# Patient Record
Sex: Male | Born: 1985 | Race: White | Hispanic: No | Marital: Single | Smoking: Former smoker
Health system: Southern US, Community
[De-identification: ages and names within clinical notes are randomized; demographics above are authoritative.]

## PROBLEM LIST (undated history)

## (undated) DIAGNOSIS — K219 Gastro-esophageal reflux disease without esophagitis: Secondary | ICD-10-CM

## (undated) DIAGNOSIS — J45909 Unspecified asthma, uncomplicated: Secondary | ICD-10-CM

## (undated) HISTORY — DX: Gastro-esophageal reflux disease without esophagitis: K21.9

## (undated) HISTORY — DX: Unspecified asthma, uncomplicated: J45.909

---

## 2015-06-21 DIAGNOSIS — J029 Acute pharyngitis, unspecified: Secondary | ICD-10-CM | POA: Diagnosis not present

## 2015-06-21 DIAGNOSIS — H5710 Ocular pain, unspecified eye: Secondary | ICD-10-CM | POA: Diagnosis not present

## 2015-10-17 DIAGNOSIS — F17200 Nicotine dependence, unspecified, uncomplicated: Secondary | ICD-10-CM | POA: Diagnosis not present

## 2015-10-17 DIAGNOSIS — R002 Palpitations: Secondary | ICD-10-CM | POA: Diagnosis not present

## 2015-10-17 DIAGNOSIS — R079 Chest pain, unspecified: Secondary | ICD-10-CM | POA: Diagnosis not present

## 2015-10-17 DIAGNOSIS — R0789 Other chest pain: Secondary | ICD-10-CM | POA: Diagnosis not present

## 2015-10-17 DIAGNOSIS — F1410 Cocaine abuse, uncomplicated: Secondary | ICD-10-CM | POA: Diagnosis not present

## 2015-10-18 DIAGNOSIS — F17200 Nicotine dependence, unspecified, uncomplicated: Secondary | ICD-10-CM | POA: Diagnosis not present

## 2015-10-18 DIAGNOSIS — R0789 Other chest pain: Secondary | ICD-10-CM | POA: Diagnosis not present

## 2015-10-18 DIAGNOSIS — F1410 Cocaine abuse, uncomplicated: Secondary | ICD-10-CM | POA: Diagnosis not present

## 2015-10-18 DIAGNOSIS — R079 Chest pain, unspecified: Secondary | ICD-10-CM | POA: Diagnosis not present

## 2015-10-18 DIAGNOSIS — R002 Palpitations: Secondary | ICD-10-CM | POA: Diagnosis not present

## 2015-12-09 DIAGNOSIS — F17200 Nicotine dependence, unspecified, uncomplicated: Secondary | ICD-10-CM | POA: Diagnosis not present

## 2015-12-09 DIAGNOSIS — R0602 Shortness of breath: Secondary | ICD-10-CM | POA: Diagnosis not present

## 2015-12-09 DIAGNOSIS — Z720 Tobacco use: Secondary | ICD-10-CM | POA: Diagnosis not present

## 2015-12-09 DIAGNOSIS — J40 Bronchitis, not specified as acute or chronic: Secondary | ICD-10-CM | POA: Diagnosis not present

## 2015-12-09 DIAGNOSIS — R0789 Other chest pain: Secondary | ICD-10-CM | POA: Diagnosis not present

## 2015-12-30 DIAGNOSIS — N342 Other urethritis: Secondary | ICD-10-CM | POA: Diagnosis not present

## 2015-12-30 DIAGNOSIS — R300 Dysuria: Secondary | ICD-10-CM | POA: Diagnosis not present

## 2016-01-14 DIAGNOSIS — K219 Gastro-esophageal reflux disease without esophagitis: Secondary | ICD-10-CM | POA: Diagnosis not present

## 2016-01-14 DIAGNOSIS — Z6827 Body mass index (BMI) 27.0-27.9, adult: Secondary | ICD-10-CM | POA: Diagnosis not present

## 2016-01-14 DIAGNOSIS — R0602 Shortness of breath: Secondary | ICD-10-CM | POA: Diagnosis not present

## 2016-01-14 DIAGNOSIS — R079 Chest pain, unspecified: Secondary | ICD-10-CM | POA: Diagnosis not present

## 2016-01-14 DIAGNOSIS — Z1389 Encounter for screening for other disorder: Secondary | ICD-10-CM | POA: Diagnosis not present

## 2016-01-15 DIAGNOSIS — Z1389 Encounter for screening for other disorder: Secondary | ICD-10-CM | POA: Diagnosis not present

## 2016-01-15 DIAGNOSIS — Z6827 Body mass index (BMI) 27.0-27.9, adult: Secondary | ICD-10-CM | POA: Diagnosis not present

## 2016-01-15 DIAGNOSIS — R0602 Shortness of breath: Secondary | ICD-10-CM | POA: Diagnosis not present

## 2016-01-21 ENCOUNTER — Encounter (INDEPENDENT_AMBULATORY_CARE_PROVIDER_SITE_OTHER): Payer: Self-pay | Admitting: Internal Medicine

## 2016-01-30 ENCOUNTER — Ambulatory Visit (INDEPENDENT_AMBULATORY_CARE_PROVIDER_SITE_OTHER): Payer: 59 | Admitting: Internal Medicine

## 2016-01-30 ENCOUNTER — Encounter (INDEPENDENT_AMBULATORY_CARE_PROVIDER_SITE_OTHER): Payer: Self-pay | Admitting: Internal Medicine

## 2016-01-30 DIAGNOSIS — K219 Gastro-esophageal reflux disease without esophagitis: Secondary | ICD-10-CM

## 2016-01-30 NOTE — Progress Notes (Signed)
   Subjective:    Patient ID: Jacob Barber, male    DOB: Jul 11, 1985, 30 y.o.   MRN: [id number]  HPI Referred by [facility] for epigastric pain/reflux. He tells me he has been having a burning sensation in his chest. It makes him anxious. Symptoms last for about an hour. Symptoms for 2-3 months. The pain is sometimes random and sometimes after he eats. No lightheadedness. Does not cough. He does have some SOB and the discomfort in the chest. He says the Nexium helps. He says he ran out of the Nexium last week and he could tell a difference. He has a cardiology tomorrow. He says his EKG have been normal.   01/15/2016 H and H 15.6 and 45.8, total bili 0.7, AlP 54, AS 34, ALT 70 Review of Systems No past medical history on file.  No past surgical history on file.  Allergies  Allergen Reactions  . Erythromycin     hives  . Sulfa Antibiotics     hives    No current outpatient prescriptions on file prior to visit.   No current facility-administered medications on file prior to visit.        Objective:   Physical Exam Blood pressure 130/80, pulse 60, temperature 98.4 F (36.9 C), height 6\' 1"  (1.854 m), weight 205 lb 9.6 oz (93.3 kg). Alert and oriented. Skin warm and dry. Oral mucosa is moist.   . Sclera anicteric, conjunctivae is pink. Thyroid not enlarged. No cervical lymphadenopathy. Lungs clear. Heart regular rate and rhythm.  Abdomen is soft. Bowel sounds are positive. No hepatomegaly. No abdominal masses felt. No tenderness.  No edema to lower extremities.          Assessment & Plan:  Chest pain. ? Etiology. Has cardiology appt tomorrow. Will til Cardiology clears before we proceed with a EGD.

## 2016-01-30 NOTE — Patient Instructions (Addendum)
Continue Nexium BID. Call and let me know what Cardiology says.

## 2016-01-31 ENCOUNTER — Encounter (INDEPENDENT_AMBULATORY_CARE_PROVIDER_SITE_OTHER): Payer: Self-pay

## 2016-01-31 ENCOUNTER — Encounter: Payer: Self-pay | Admitting: Cardiology

## 2016-01-31 ENCOUNTER — Ambulatory Visit (INDEPENDENT_AMBULATORY_CARE_PROVIDER_SITE_OTHER): Payer: 59 | Admitting: Cardiology

## 2016-01-31 VITALS — BP 126/80 | HR 75 | Ht 73.0 in | Wt 207.0 lb

## 2016-01-31 DIAGNOSIS — Z720 Tobacco use: Secondary | ICD-10-CM

## 2016-01-31 DIAGNOSIS — R0602 Shortness of breath: Secondary | ICD-10-CM

## 2016-01-31 DIAGNOSIS — J45909 Unspecified asthma, uncomplicated: Secondary | ICD-10-CM | POA: Insufficient documentation

## 2016-01-31 DIAGNOSIS — R072 Precordial pain: Secondary | ICD-10-CM

## 2016-01-31 DIAGNOSIS — Z8249 Family history of ischemic heart disease and other diseases of the circulatory system: Secondary | ICD-10-CM | POA: Diagnosis not present

## 2016-01-31 DIAGNOSIS — R079 Chest pain, unspecified: Secondary | ICD-10-CM | POA: Insufficient documentation

## 2016-01-31 DIAGNOSIS — R5383 Other fatigue: Secondary | ICD-10-CM | POA: Insufficient documentation

## 2016-01-31 NOTE — Patient Instructions (Signed)
Your physician recommends that you schedule a follow-up appointment in: to be determined after tests    Your physician has requested that you have a stress echocardiogram. For further information please visit [email or web address]. Please follow instruction sheet as given.     Thank you for choosing [facility] !

## 2016-01-31 NOTE — Progress Notes (Signed)
Cardiology Office Note  Date: 01/31/2016   ID: Jacob Barber, DOB 01-25-1986, MRN [id number]  PCP: Cassell SmilesFUSCO,LAWRENCE J., MD  Referring provider: Assunta FoundShuvon Rankin, NP Consulting Cardiologist: Nona DellSamuel Ajeenah Heiny, MD   Chief Complaint  Patient presents with  . Shortness of Breath  . Chest discomfort    History of Present Illness: Jacob Barber is a 30 y.o. male referred for cardiology consultation from [facility]. 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 He states that over the last few months he has been experiencing episodes of shortness of breath associated with a feeling of generalized fullness or tightness in his chest. This does not occur with exertion, sometimes happens after meals, but other times are just sporadic. He describes moderate intensity discomfort that makes him feel fatigued thereafter. Symptoms usually last for about an hour. Sometimes he takes a nap after this happens. He has had no palpitations or syncope. He felt that it might be reflux symptoms, has been on Nexium for about 2 weeks, possibly some improvement but not resolution.  He does have family history of CAD in his father diagnosed in his 3250s. No personal history of hypertension or hyperlipidemia. He smokes cigarettes occasionally.  Personally reviewed his ECG today which shows sinus rhythm, borderline short PR interval but no delta waves. He had recent lab work as outlined below.  Past Medical History:  Diagnosis Date  . Childhood asthma   . GERD (gastroesophageal reflux disease)     History reviewed. No pertinent surgical history.  Current Outpatient Prescriptions  Medication Sig Dispense Refill  . esomeprazole (NEXIUM) 20 MG capsule Take 20 mg by mouth 2 (two) times daily before a meal.     No current facility-administered medications for this visit.    Allergies:  Erythromycin and Sulfa antibiotics   Social History: The patient  reports that he has been smoking.  He started smoking about  4 years ago. He has never used smokeless tobacco. He reports that he drinks alcohol. He reports that he does not use drugs.   Family History: The patient's family history includes CAD in his father; Sleep apnea in his father.   ROS:  Please see the history of present illness. Otherwise, complete review of systems is positive for none.  All other systems are reviewed and negative.   Physical Exam: VS:  BP 126/80 (BP Location: Right Arm)   Pulse 75   Ht 6\' 1"  (1.854 m)   Wt 207 lb (93.9 kg)   SpO2 97%   BMI 27.31 kg/m , BMI Body mass index is 27.31 kg/m.  Wt Readings from Last 3 Encounters:  01/31/16 207 lb (93.9 kg)  01/30/16 205 lb 9.6 oz (93.3 kg)    General: Patient appears comfortable at rest. HEENT: Conjunctiva and lids normal, oropharynx clear. Neck: Supple, no elevated JVP or carotid bruits, no thyromegaly. Lungs: Clear to auscultation, nonlabored breathing at rest. Cardiac: Regular rate and rhythm, no S3 or significant systolic murmur, no pericardial rub. Abdomen: Soft, nontender, bowel sounds present, no guarding or rebound. Extremities: No pitting edema, distal pulses 2+. Skin: Warm and dry. Musculoskeletal: No kyphosis. Neuropsychiatric: Alert and oriented x3, affect grossly appropriate.  ECG: No old tracing for comparison.  Recent Labwork:  September 2017: Troponin I less than 0.01, CK 101, hemoglobin 15.6, platelets 240, BUN 10, creatinine 1.2, potassium 4.1, AST 34, ALT 70, H pylori IgG negative  Assessment and Plan:  1. Recurring precordial discomfort as well as dyspnea. ECG is overall  nonspecific. He has been using Nexium for the last few weeks for possible reflux, symptoms have not resolved as yet. Family history includes premature CAD in his father. He also has a history of intermittent tobacco use. Plan is to evaluate further for ischemic heart disease with an exercise echocardiogram. If this is reassuring, may want to consider further GI workup.  2.  Intermittent tobacco use. We did discuss smoking cessation.  3. History of childhood asthma, no obvious flareups or wheezing.  4. Recent lab work showing ALT 70, AST 34. No abdominal symptoms. Follow-up with PCP.  Current medicines were reviewed with the patient today.   Orders Placed This Encounter  Procedures  . EKG 12-Lead  . ECHOCARDIOGRAM STRESS TEST    Disposition: Call with test results.  Signed, Jonelle Sidle, MD, [facility] 01/31/2016 8:36 AM    [facility] at [facility] [address] [place], [place] [address] Phone: [phone]; Fax: [phone]

## 2016-02-07 ENCOUNTER — Ambulatory Visit (HOSPITAL_COMMUNITY)
Admission: RE | Admit: 2016-02-07 | Discharge: 2016-02-07 | Disposition: A | Discharge status: Home / Self Care | Payer: 59 | Source: Ambulatory Visit | Attending: Cardiology | Admitting: Cardiology

## 2016-02-07 DIAGNOSIS — R072 Precordial pain: Secondary | ICD-10-CM | POA: Diagnosis not present

## 2016-02-07 DIAGNOSIS — R079 Chest pain, unspecified: Secondary | ICD-10-CM | POA: Diagnosis present

## 2016-02-07 DIAGNOSIS — R0602 Shortness of breath: Secondary | ICD-10-CM | POA: Diagnosis not present

## 2016-02-07 LAB — ECHOCARDIOGRAM STRESS TEST
CHL CUP MPHR: 192 {beats}/min
CSEPEDS: 46 s
CSEPHR: 95 %
Estimated workload: 16.3 METS
Exercise duration (min): 12 min
Peak HR: 184 {beats}/min
RPE: 17
Rest HR: 69 {beats}/min

## 2016-02-07 NOTE — Progress Notes (Signed)
*  PRELIMINARY RESULTS* Echocardiogram Echocardiogram Stress Test has been performed.  Stacey DrainWhite, Lawernce Earll J 02/07/2016, 10:23 AM

## 2016-04-23 ENCOUNTER — Telehealth (INDEPENDENT_AMBULATORY_CARE_PROVIDER_SITE_OTHER): Payer: Self-pay | Admitting: *Deleted

## 2016-04-23 NOTE — Telephone Encounter (Addendum)
Patient was seen in October for chest pain/epigastric pain -- patient states still having issues, not heart related wants to proceed with EGD, please advise if ok  Ph# [phone]

## 2016-04-23 NOTE — Telephone Encounter (Signed)
Talked with patient. He needs Cardiology clearance. Will fax or email. So be on look out

## 2016-04-29 ENCOUNTER — Other Ambulatory Visit (INDEPENDENT_AMBULATORY_CARE_PROVIDER_SITE_OTHER): Payer: Self-pay | Admitting: Internal Medicine

## 2016-04-29 ENCOUNTER — Telehealth (INDEPENDENT_AMBULATORY_CARE_PROVIDER_SITE_OTHER): Payer: Self-pay | Admitting: Internal Medicine

## 2016-04-29 DIAGNOSIS — R0789 Other chest pain: Secondary | ICD-10-CM | POA: Insufficient documentation

## 2016-04-29 NOTE — Telephone Encounter (Signed)
Left message for patient to call me to schedule EGD

## 2016-04-29 NOTE — Telephone Encounter (Signed)
Jacob Barber, EGD. I have spoken with patient.  

## 2016-04-29 NOTE — Telephone Encounter (Signed)
EGD sch'd 05/08/16 at 1155 (1050), patient aware, verbal instructions given

## 2016-05-08 ENCOUNTER — Encounter (HOSPITAL_COMMUNITY): Payer: Self-pay | Admitting: *Deleted

## 2016-05-08 ENCOUNTER — Ambulatory Visit (HOSPITAL_COMMUNITY)
Admission: RE | Admit: 2016-05-08 | Discharge: 2016-05-08 | Disposition: A | Discharge status: Home Health | Payer: 59 | Source: Ambulatory Visit | Attending: Internal Medicine | Admitting: Internal Medicine

## 2016-05-08 ENCOUNTER — Encounter (HOSPITAL_COMMUNITY)
Admission: RE | Disposition: A | Discharge status: Home Health | Payer: Self-pay | Source: Ambulatory Visit | Attending: Internal Medicine

## 2016-05-08 DIAGNOSIS — K219 Gastro-esophageal reflux disease without esophagitis: Secondary | ICD-10-CM | POA: Diagnosis not present

## 2016-05-08 DIAGNOSIS — F17210 Nicotine dependence, cigarettes, uncomplicated: Secondary | ICD-10-CM | POA: Diagnosis not present

## 2016-05-08 DIAGNOSIS — R12 Heartburn: Secondary | ICD-10-CM | POA: Insufficient documentation

## 2016-05-08 DIAGNOSIS — R0789 Other chest pain: Secondary | ICD-10-CM | POA: Insufficient documentation

## 2016-05-08 HISTORY — PX: ESOPHAGOGASTRODUODENOSCOPY: SHX5428

## 2016-05-08 SURGERY — EGD (ESOPHAGOGASTRODUODENOSCOPY)
Anesthesia: Moderate Sedation

## 2016-05-08 MED ORDER — PROMETHAZINE HCL 25 MG/ML IJ SOLN
INTRAMUSCULAR | Status: AC
  Filled 2016-05-08: qty 1

## 2016-05-08 MED ORDER — PANTOPRAZOLE SODIUM 40 MG PO TBEC: 40.0000 mg | DELAYED_RELEASE_TABLET | Freq: Two times a day (BID) | ORAL | 2 refills | Status: DC

## 2016-05-08 MED ORDER — MEPERIDINE HCL 50 MG/ML IJ SOLN
INTRAMUSCULAR | Status: AC
  Filled 2016-05-08: qty 1

## 2016-05-08 MED ORDER — MIDAZOLAM HCL 5 MG/5ML IJ SOLN
INTRAMUSCULAR | Status: AC
  Filled 2016-05-08: qty 10

## 2016-05-08 MED ORDER — BUTAMBEN-TETRACAINE-BENZOCAINE 2-2-14 % EX AERO
INHALATION_SPRAY | CUTANEOUS | Status: DC | PRN
  Administered 2016-05-08: 2 via TOPICAL

## 2016-05-08 MED ORDER — PROMETHAZINE HCL 25 MG/ML IJ SOLN
12.5000 mg | Freq: Four times a day (QID) | INTRAMUSCULAR | Status: AC | PRN
  Administered 2016-05-08: 12.5 mg via INTRAVENOUS

## 2016-05-08 MED ORDER — SODIUM CHLORIDE 0.9% FLUSH
INTRAVENOUS | Status: AC
  Filled 2016-05-08: qty 10

## 2016-05-08 MED ORDER — SODIUM CHLORIDE 0.9 % IV SOLN: INTRAVENOUS | Status: DC

## 2016-05-08 MED ORDER — MEPERIDINE HCL 50 MG/ML IJ SOLN
INTRAMUSCULAR | Status: DC | PRN
  Administered 2016-05-08 (×2): 25 mg via INTRAVENOUS

## 2016-05-08 MED ORDER — STERILE WATER FOR IRRIGATION IR SOLN
Status: DC | PRN
  Administered 2016-05-08: 10:00:00

## 2016-05-08 MED ORDER — MIDAZOLAM HCL 5 MG/5ML IJ SOLN
INTRAMUSCULAR | Status: DC | PRN
  Administered 2016-05-08 (×4): 2 mg via INTRAVENOUS

## 2016-05-08 NOTE — Discharge Instructions (Signed)
Discontinue Nexium. Begin pantoprazole 40 mg by mouth 30 minutes before breakfast and evening meal daily. Resume usual diet. No driving for 24 hours. Will schedule abdominal ultrasound. Office will call next week. Symptom diary for next 8 weeks until office visit.  Esophagogastroduodenoscopy, Care After Introduction Refer to this sheet in the next few weeks. These instructions provide you with information about caring for yourself after your procedure. Your health care provider may also give you more specific instructions. Your treatment has been planned according to current medical practices, but problems sometimes occur. Call your health care provider if you have any problems or questions after your procedure. What can I expect after the procedure? After the procedure, it is common to have:  A sore throat.  Nausea.  Bloating.  Dizziness.  Fatigue. Follow these instructions at home:  Do not eat or drink anything until the numbing medicine (local anesthetic) has worn off and your gag reflex has returned. You will know that the local anesthetic has worn off when you can swallow comfortably.  Do not drive for 24 hours if you received a medicine to help you relax (sedative).  If your health care provider took a tissue sample for testing during the procedure, make sure to get your test results. This is your responsibility. Ask your health care provider or the department performing the test when your results will be ready.  Keep all follow-up visits as told by your health care provider. This is important. Contact a health care provider if:  You cannot stop coughing.  You are not urinating.  You are urinating less than usual. Get help right away if:  You have trouble swallowing.  You cannot eat or drink.  You have throat or chest pain that gets worse.  You are dizzy or light-headed.  You faint.  You have nausea or vomiting.  You have chills.  You have a fever.  You  have severe abdominal pain.  You have black, tarry, or bloody stools.   Gastroesophageal Reflux Disease, Adult Normally, food travels down the esophagus and stays in the stomach to be digested. However, when a person has gastroesophageal reflux disease (GERD), food and stomach acid move back up into the esophagus. When this happens, the esophagus becomes sore and inflamed. Over time, GERD can create small holes (ulcers) in the lining of the esophagus. What are the causes? This condition is caused by a problem with the muscle between the esophagus and the stomach (lower esophageal sphincter, or LES). Normally, the LES muscle closes after food passes through the esophagus to the stomach. When the LES is weakened or abnormal, it does not close properly, and that allows food and stomach acid to go back up into the esophagus. The LES can be weakened by certain dietary substances, medicines, and medical conditions, including:  Tobacco use.  Pregnancy.  Having a hiatal hernia.  Heavy alcohol use.  Certain foods and beverages, such as coffee, chocolate, onions, and peppermint. What increases the risk? This condition is more likely to develop in:  People who have an increased body weight.  People who have connective tissue disorders.  People who use NSAID medicines. What are the signs or symptoms? Symptoms of this condition include:  Heartburn.  Difficult or painful swallowing.  The feeling of having a lump in the throat.  Abitter taste in the mouth.  Bad breath.  Having a large amount of saliva.  Having an upset or bloated stomach.  Belching.  Chest pain.  Shortness of  breath or wheezing.  Ongoing (chronic) cough or a night-time cough.  Wearing away of tooth enamel.  Weight loss. Different conditions can cause chest pain. Make sure to see your health care provider if you experience chest pain. How is this diagnosed? Your health care provider will take a medical  history and perform a physical exam. To determine if you have mild or severe GERD, your health care provider may also monitor how you respond to treatment. You may also have other tests, including:  An endoscopy toexamine your stomach and esophagus with a small camera.  A test thatmeasures the acidity level in your esophagus.  A test thatmeasures how much pressure is on your esophagus.  A barium swallow or modified barium swallow to show the shape, size, and functioning of your esophagus. How is this treated? The goal of treatment is to help relieve your symptoms and to prevent complications. Treatment for this condition may vary depending on how severe your symptoms are. Your health care provider may recommend:  Changes to your diet.  Medicine.  Surgery. Follow these instructions at home: Diet  Follow a diet as recommended by your health care provider. This may involve avoiding foods and drinks such as:  Coffee and tea (with or without caffeine).  Drinks that containalcohol.  Energy drinks and sports drinks.  Carbonated drinks or sodas.  Chocolate and cocoa.  Peppermint and mint flavorings.  Garlic and onions.  Horseradish.  Spicy and acidic foods, including peppers, chili powder, curry powder, vinegar, hot sauces, and barbecue sauce.  Citrus fruit juices and citrus fruits, such as oranges, lemons, and limes.  Tomato-based foods, such as red sauce, chili, salsa, and pizza with red sauce.  Fried and fatty foods, such as donuts, french fries, potato chips, and high-fat dressings.  High-fat meats, such as hot dogs and fatty cuts of red and white meats, such as rib eye steak, sausage, ham, and bacon.  High-fat dairy items, such as whole milk, butter, and cream cheese.  Eat small, frequent meals instead of large meals.  Avoid drinking large amounts of liquid with your meals.  Avoid eating meals during the 2-3 hours before bedtime.  Avoid lying down right after  you eat.  Do not exercise right after you eat. General instructions  Pay attention to any changes in your symptoms.  Take over-the-counter and prescription medicines only as told by your health care provider. Do not take aspirin, ibuprofen, or other NSAIDs unless your health care provider told you to do so.  Do not use any tobacco products, including cigarettes, chewing tobacco, and e-cigarettes. If you need help quitting, ask your health care provider.  Wear loose-fitting clothing. Do not wear anything tight around your waist that causes pressure on your abdomen.  Raise (elevate) the head of your bed 6 inches (15cm).  Try to reduce your stress, such as with yoga or meditation. If you need help reducing stress, ask your health care provider.  If you are overweight, reduce your weight to an amount that is healthy for you. Ask your health care provider for guidance about a safe weight loss goal.  Keep all follow-up visits as told by your health care provider. This is important. Contact a health care provider if:  You have new symptoms.  You have unexplained weight loss.  You have difficulty swallowing, or it hurts to swallow.  You have wheezing or a persistent cough.  Your symptoms do not improve with treatment.  You have a hoarse voice. Get  help right away if:  You have pain in your arms, neck, jaw, teeth, or back.  You feel sweaty, dizzy, or light-headed.  You have chest pain or shortness of breath.  You vomit and your vomit looks like blood or coffee grounds.  You faint.  Your stool is bloody or black.  You cannot swallow, drink, or eat.

## 2016-05-08 NOTE — Op Note (Signed)
[facility] Patient Name: Jacob SimmeringJoseph Geno Procedure Date: 05/08/2016 9:04 AM MRN: [id number] Date of Birth: 09-03-1985 Attending MD: Lionel DecemberNajeeb Rehman , MD CSN: [id number] Age: 31 Admit Type: Outpatient Procedure:                Upper GI endoscopy Indications:              Chest pain (non cardiac) Providers:                Lionel DecemberNajeeb Rehman, MD, Loma MessingLurae B. Patsy LagerAlbert RN, RN, Dyann Ruddleonya                            Wilson Referring MD:             Elfredia NevinsLawrence Fusco, MD Medicines:                Promethazine 12.5 mg IV, Cetacaine spray,                            Meperidine 50 mg IV, Midazolam 8 mg IV Complications:            No immediate complications. Estimated Blood Loss:     Estimated blood loss: none. Procedure:                Pre-Anesthesia Assessment:                           - Prior to the procedure, a History and Physical                            was performed, and patient medications and                            allergies were reviewed. The patient's tolerance of                            previous anesthesia was also reviewed. The risks                            and benefits of the procedure and the sedation                            options and risks were discussed with the patient.                            All questions were answered, and informed consent                            was obtained. Prior Anticoagulants: The patient has                            taken no previous anticoagulant or antiplatelet                            agents. ASA Grade Assessment: I - A normal, healthy  patient. After reviewing the risks and benefits,                            the patient was deemed in satisfactory condition to                            undergo the procedure.                           After obtaining informed consent, the endoscope was                            passed under direct vision. Throughout the                            procedure, the patient's  blood pressure, pulse, and                            oxygen saturations were monitored continuously. The                            EG-[id number] ([id number]) was introduced through the                            mouth, and advanced to the second part of duodenum.                            The upper GI endoscopy was accomplished without                            difficulty. The patient tolerated the procedure                            well. Scope In: 9:37:03 AM Scope Out: 9:41:12 AM Total Procedure Duration: 0 hours 4 minutes 9 seconds  Findings:      The examined esophagus was normal.      The Z-line was regular and was found 44 cm from the incisors.      The entire examined stomach was normal.      The duodenal bulb and second portion of the duodenum were normal. Impression:               - Normal esophagus.                           - Z-line regular, 44 cm from the incisors.                           - Normal stomach.                           - Normal duodenal bulb and second portion of the                            duodenum.                           -  No specimens collected. Moderate Sedation:      Moderate (conscious) sedation was administered by the endoscopy nurse       and supervised by the endoscopist. The following parameters were       monitored: oxygen saturation, heart rate, blood pressure, CO2       capnography and response to care. Total physician intraservice time was       11 minutes. Recommendation:           - Patient has a contact number available for                            emergencies. The signs and symptoms of potential                            delayed complications were discussed with the                            patient. Return to normal activities tomorrow.                            Written discharge instructions were provided to the                            patient.                           - Resume previous diet today.                            - Continue present medications.                           - Perform an abdominal ultrasound.                           - Discontinue OTC Nexium                           - Use Protonix (pantoprazole) 40 mg PO BID. Procedure Code(s):        --- Professional ---                           [phone], Esophagogastroduodenoscopy, flexible,                            transoral; diagnostic, including collection of                            specimen(s) by brushing or washing, when performed                            (separate procedure)                           99152, Moderate sedation services provided by the  same physician or other qualified health care                            professional performing the diagnostic or                            therapeutic service that the sedation supports,                            requiring the presence of an independent trained                            observer to assist in the monitoring of the                            patient's level of consciousness and physiological                            status; initial 15 minutes of intraservice time,                            patient age 30 years or older Diagnosis Code(s):        --- Professional ---                           R07.89, Other chest pain CPT copyright 2016 American Medical Association. All rights reserved. The codes documented in this report are preliminary and upon coder review may  be revised to meet current compliance requirements. Lionel December, MD Lionel December, MD 05/08/2016 9:50:57 AM This report has been signed electronically. Number of Addenda: 0

## 2016-05-08 NOTE — H&P (Signed)
Jacob Barber is an 31 y.o. male.   Chief Complaint: Patient is here for EGD. HPI: Patient is 31 year old Caucasian male whose been having intermittent chest pain and heartburn since July 2017. He was begun on Nexium 20 mg twice a day with symptoms are not completely resolved. He has not been taking his medication on schedule. He had cardiac evaluation and stress echo was negative. He denies dysphagia nausea vomiting abdominal pain or melena.  Past Medical History:  Diagnosis Date  . Childhood asthma   . GERD (gastroesophageal reflux disease)     History reviewed. No pertinent surgical history.  Family History  Problem Relation Age of Onset  . CAD Father     Diagnosed in his 5350s  . Sleep apnea Father    Social History:  reports that he has been smoking Cigarettes.  He started smoking about 4 years ago. He has never used smokeless tobacco. He reports that he drinks alcohol. He reports that he does not use drugs.  Allergies:  Allergies  Allergen Reactions  . Erythromycin     hives  . Sulfa Antibiotics     hives    Medications Prior to Admission  Medication Sig Dispense Refill  . Alum Hydroxide-Mag Trisilicate (GAVISCON) 80-14.2 MG CHEW Chew 2-4 tablets by mouth 3 (three) times daily as needed (heartburn).    Marland Kitchen. esomeprazole (NEXIUM) 20 MG capsule Take 20 mg by mouth 2 (two) times daily.     . Naphazoline HCl (CLEAR EYES OP) Apply 1 drop to eye daily as needed (irritation).      No results found for this or any previous visit (from the past 48 hour(s)). No results found.  ROS  Blood pressure 120/70, pulse 64, temperature 98.3 F (36.8 C), temperature source Oral, resp. rate 17, height 6\' 1"  (1.854 m), weight 205 lb (93 kg), SpO2 98 %. Physical Exam  Constitutional: He appears well-nourished.  HENT:  Mouth/Throat: Oropharynx is clear and moist.  Eyes: Conjunctivae are normal. No scleral icterus.  Neck: No thyromegaly present.  Cardiovascular: Normal rate, regular rhythm  and normal heart sounds.   No murmur heard. Respiratory: Effort normal and breath sounds normal.  GI: Soft. He exhibits no distension and no mass. There is no tenderness.  Musculoskeletal: He exhibits no edema.  Lymphadenopathy:    He has no cervical adenopathy.  Neurological: He is alert.  Skin: Skin is warm.     Assessment/Plan Noncardiac chest pain/GERD. Diagnostic EGD.  Lionel DecemberNajeeb Fernandez Kenley, MD 05/08/2016, 9:25 AM

## 2016-05-11 ENCOUNTER — Encounter (HOSPITAL_COMMUNITY): Payer: Self-pay | Admitting: Internal Medicine

## 2016-05-11 ENCOUNTER — Other Ambulatory Visit (INDEPENDENT_AMBULATORY_CARE_PROVIDER_SITE_OTHER): Payer: Self-pay | Admitting: *Deleted

## 2016-05-11 DIAGNOSIS — R0789 Other chest pain: Secondary | ICD-10-CM

## 2016-05-14 ENCOUNTER — Ambulatory Visit (HOSPITAL_COMMUNITY): Admission: RE | Admit: 2016-05-14 | Payer: 59 | Source: Ambulatory Visit

## 2016-05-14 ENCOUNTER — Ambulatory Visit (HOSPITAL_COMMUNITY)
Admission: RE | Admit: 2016-05-14 | Discharge: 2016-05-14 | Disposition: A | Discharge status: Home / Self Care | Payer: 59 | Source: Ambulatory Visit | Attending: Internal Medicine | Admitting: Internal Medicine

## 2016-05-14 DIAGNOSIS — R932 Abnormal findings on diagnostic imaging of liver and biliary tract: Secondary | ICD-10-CM | POA: Insufficient documentation

## 2016-05-14 DIAGNOSIS — R0789 Other chest pain: Secondary | ICD-10-CM | POA: Insufficient documentation

## 2016-05-14 DIAGNOSIS — K760 Fatty (change of) liver, not elsewhere classified: Secondary | ICD-10-CM | POA: Diagnosis not present

## 2016-05-18 ENCOUNTER — Other Ambulatory Visit (INDEPENDENT_AMBULATORY_CARE_PROVIDER_SITE_OTHER): Payer: Self-pay | Admitting: *Deleted

## 2016-05-18 DIAGNOSIS — R5383 Other fatigue: Secondary | ICD-10-CM

## 2016-05-18 DIAGNOSIS — R740 Nonspecific elevation of levels of transaminase and lactic acid dehydrogenase [LDH]: Secondary | ICD-10-CM

## 2016-05-18 DIAGNOSIS — K760 Fatty (change of) liver, not elsewhere classified: Secondary | ICD-10-CM

## 2016-05-18 DIAGNOSIS — R7401 Elevation of levels of liver transaminase levels: Secondary | ICD-10-CM

## 2016-05-18 DIAGNOSIS — R0789 Other chest pain: Secondary | ICD-10-CM

## 2016-06-14 DIAGNOSIS — J00 Acute nasopharyngitis [common cold]: Secondary | ICD-10-CM | POA: Diagnosis not present

## 2016-06-14 DIAGNOSIS — B309 Viral conjunctivitis, unspecified: Secondary | ICD-10-CM | POA: Diagnosis not present

## 2016-12-11 DIAGNOSIS — Z6828 Body mass index (BMI) 28.0-28.9, adult: Secondary | ICD-10-CM | POA: Diagnosis not present

## 2016-12-11 DIAGNOSIS — Z1389 Encounter for screening for other disorder: Secondary | ICD-10-CM | POA: Diagnosis not present

## 2016-12-11 DIAGNOSIS — K611 Rectal abscess: Secondary | ICD-10-CM | POA: Diagnosis not present

## 2016-12-11 DIAGNOSIS — E663 Overweight: Secondary | ICD-10-CM | POA: Diagnosis not present

## 2016-12-31 ENCOUNTER — Ambulatory Visit: Payer: 59 | Admitting: General Surgery

## 2018-01-15 ENCOUNTER — Telehealth: Payer: Self-pay | Admitting: Physician Assistant

## 2018-01-15 DIAGNOSIS — J019 Acute sinusitis, unspecified: Secondary | ICD-10-CM

## 2018-01-15 MED ORDER — FLUTICASONE PROPIONATE 50 MCG/ACT NA SUSP: 2.0000 | Freq: Every day | NASAL | 0 refills | Status: DC

## 2018-01-15 NOTE — Progress Notes (Signed)

## 2018-01-21 ENCOUNTER — Telehealth: Payer: Self-pay | Admitting: Family

## 2018-01-21 DIAGNOSIS — J028 Acute pharyngitis due to other specified organisms: Secondary | ICD-10-CM

## 2018-01-21 DIAGNOSIS — B9689 Other specified bacterial agents as the cause of diseases classified elsewhere: Secondary | ICD-10-CM

## 2018-01-21 MED ORDER — PREDNISONE 5 MG PO TABS: 5.0000 mg | ORAL_TABLET | ORAL | 0 refills | Status: DC

## 2018-01-21 MED ORDER — BENZONATATE 100 MG PO CAPS: 100.0000 mg | ORAL_CAPSULE | Freq: Three times a day (TID) | ORAL | 0 refills | Status: DC | PRN

## 2018-01-21 MED ORDER — DOXYCYCLINE HYCLATE 100 MG PO TABS
100.0000 mg | ORAL_TABLET | Freq: Two times a day (BID) | ORAL | 0 refills | Status: DC
Start: 2018-01-21 — End: 2018-05-21

## 2018-01-21 NOTE — Progress Notes (Signed)
Thank you for the details you included in the comment boxes. Those details are very helpful in determining the best course of treatment for you and help [place] to provide the best care. You do have some flu-like symptoms, yet this is more bacterial. See treatment plan below.  We are sorry that you are not feeling well.  Here is how we plan to help!  Based on your presentation I believe you most likely have A cough due to bacteria.  When patients have a fever and a productive cough with a change in color or increased sputum production, we are concerned about bacterial bronchitis.  If left untreated it can progress to pneumonia.  If your symptoms do not improve with your treatment plan it is important that you contact your provider.   I have prescribed Doxycycline 100 mg twice a day for 7 days     In addition you may use A non-prescription cough medication called Mucinex DM: take 2 tablets every 12 hours. and A prescription cough medication called Tessalon Perles 100mg . You may take 1-2 capsules every 8 hours as needed for your cough.  Prednisone 5 mg daily for 6 days (see taper instructions below)  Directions for 6 day taper: Day 1: 2 tablets before breakfast, 1 after both lunch & dinner and 2 at bedtime Day 2: 1 tab before breakfast, 1 after both lunch & dinner and 2 at bedtime Day 3: 1 tab at each meal & 1 at bedtime Day 4: 1 tab at breakfast, 1 at lunch, 1 at bedtime Day 5: 1 tab at breakfast & 1 tab at bedtime Day 6: 1 tab at breakfast   From your responses in the eVisit questionnaire you describe inflammation in the upper respiratory tract which is causing a significant cough.  This is commonly called Bronchitis and has four common causes:    Allergies  Viral Infections  Acid Reflux  Bacterial Infection Allergies, viruses and acid reflux are treated by controlling symptoms or eliminating the cause. An example might be a cough caused by taking certain blood pressure medications. You stop  the cough by changing the medication. Another example might be a cough caused by acid reflux. Controlling the reflux helps control the cough.  USE OF BRONCHODILATOR ("RESCUE") INHALERS: There is a risk from using your bronchodilator too frequently.  The risk is that over-reliance on a medication which only relaxes the muscles surrounding the breathing tubes can reduce the effectiveness of medications prescribed to reduce swelling and congestion of the tubes themselves.  Although you feel brief relief from the bronchodilator inhaler, your asthma may actually be worsening with the tubes becoming more swollen and filled with mucus.  This can delay other crucial treatments, such as oral steroid medications. If you need to use a bronchodilator inhaler daily, several times per day, you should discuss this with your provider.  There are probably better treatments that could be used to keep your asthma under control.     HOME CARE . Only take medications as instructed by your medical team. . Complete the entire course of an antibiotic. . Drink plenty of fluids and get plenty of rest. . Avoid close contacts especially the very young and the elderly . Cover your mouth if you cough or cough into your sleeve. . Always remember to wash your hands . A steam or ultrasonic humidifier can help congestion.   GET HELP RIGHT AWAY IF: . You develop worsening fever. . You become short of breath . You  cough up blood. . Your symptoms persist after you have completed your treatment plan MAKE SURE YOU   Understand these instructions.  Will watch your condition.  Will get help right away if you are not doing well or get worse.  Your e-visit answers were reviewed by a board certified advanced clinical practitioner to complete your personal care plan.  Depending on the condition, your plan could have included both over the counter or prescription medications. If there is a problem please reply  once you have received  a response from your provider. Your safety is important to [place].  If you have drug allergies check your prescription carefully.    You can use MyChart to ask questions about today's visit, request a non-urgent call back, or ask for a work or school excuse for 24 hours related to this e-Visit. If it has been greater than 24 hours you will need to follow up with your provider, or enter a new e-Visit to address those concerns. You will get an e-mail in the next two days asking about your experience.  I hope that your e-visit has been valuable and will speed your recovery. Thank you for using e-visits.

## 2018-02-05 ENCOUNTER — Telehealth: Payer: Self-pay | Admitting: Family

## 2018-02-05 DIAGNOSIS — L0291 Cutaneous abscess, unspecified: Secondary | ICD-10-CM

## 2018-02-05 NOTE — Progress Notes (Signed)
Based on what you shared with me it looks like you have a serious condition that should be evaluated in a face to face office visit.  NOTE: If you entered your credit card information for this eVisit, you will not be charged. You may see a "hold" on your card for the $30 but that hold will drop off and you will not have a charge processed.  If you are having a true medical emergency please call 911.  If you need an urgent face to face visit, [facility] has four urgent care centers for your convenience.  If you need care fast and have a high deductible or no insurance consider:   [email or web address].instacarecheckin.com/ to reserve your spot online an avoid wait times  [facility] [address], [address] [place], [place] [address] 8 am to 8 pm Monday-Friday 10 am to 4 pm Saturday-Sunday *Across the street from [employer]  [facility]  [address] [place], [address] 8 am to 5 pm Monday-Friday * In the [facility]   The following sites will take your  insurance:  . [facility]  [phone] Get Driving Directions Find a Provider at this Location  [address] [place], [place] [address] . 10 am to 8 pm Monday-Friday . 12 pm to 8 pm Saturday-Sunday   . [facility]  [phone] Get Driving Directions Find a Provider at this Location  [address], [address] [place], [place] [address] . 8 am to 8 pm Monday-Friday . 9 am to 6 pm Saturday . 11 am to 6 pm Sunday   . [facility] [place]  [phone] Get Driving Directions  [address] [place], [place] [address] . 8 am to 8 pm Monday-Friday . 8 am to 4 pm Saturday-Sunday   Your e-visit answers were reviewed by a board certified advanced clinical practitioner to complete your personal care plan.  Thank you for using e-Visits.  

## 2018-05-21 ENCOUNTER — Other Ambulatory Visit: Payer: Self-pay

## 2018-05-21 ENCOUNTER — Ambulatory Visit (HOSPITAL_COMMUNITY)
Admission: EM | Admit: 2018-05-21 | Discharge: 2018-05-21 | Disposition: A | Discharge status: Home / Self Care | Payer: No Typology Code available for payment source | Attending: Family Medicine | Admitting: Family Medicine

## 2018-05-21 ENCOUNTER — Encounter (HOSPITAL_COMMUNITY): Payer: Self-pay

## 2018-05-21 DIAGNOSIS — J069 Acute upper respiratory infection, unspecified: Secondary | ICD-10-CM | POA: Diagnosis not present

## 2018-05-21 DIAGNOSIS — B9789 Other viral agents as the cause of diseases classified elsewhere: Secondary | ICD-10-CM

## 2018-05-21 DIAGNOSIS — B353 Tinea pedis: Secondary | ICD-10-CM

## 2018-05-21 MED ORDER — OXYMETAZOLINE HCL 0.05 % NA SOLN: 1.0000 | Freq: Two times a day (BID) | NASAL | 0 refills | Status: DC

## 2018-05-21 MED ORDER — TOLNAFTATE 1 % EX CREA
1.0000 | TOPICAL_CREAM | Freq: Two times a day (BID) | CUTANEOUS | 0 refills | Status: DC
Start: 2018-05-21 — End: 2019-06-02

## 2018-05-21 MED ORDER — FLUTICASONE PROPIONATE 50 MCG/ACT NA SUSP: 2.0000 | Freq: Every day | NASAL | 0 refills | Status: DC

## 2018-05-21 MED ORDER — CETIRIZINE-PSEUDOEPHEDRINE ER 5-120 MG PO TB12: 1.0000 | ORAL_TABLET | Freq: Every day | ORAL | 0 refills | Status: DC

## 2018-05-21 NOTE — ED Triage Notes (Signed)
Pt presents today with sinus pressure, congestion, sometimes productive cough, and headache x4 days. Also has places on the bottoms of his feet he would like checked out.

## 2018-05-21 NOTE — Discharge Instructions (Signed)
Get plenty of rest and push fluids Continue with tessalon perles as needed for cough Discontinue sudafed and begin taking zyrtec-D prescribed for nasal congestion, runny nose, and/or sore throat Flonase prescribed for nasal congestion and runny nose Use medications daily for symptom relief Afrin as needed for nasal congestion.  Do NOT take longer than 3 days or you may experience rebound congestion Use OTC medications like ibuprofen or tylenol as needed fever or pain Follow up with PCP if symptoms persist Return or go to ER if you have any new or worsening symptoms fever, chills, nausea, vomiting, chest pain, cough, shortness of breath, wheezing, abdominal pain, changes in bowel or bladder habits, etc...  Prescribed tinactin cream use twice daily for at least 4 weeks Keep feet dry.  You may try using baby powder to help absorb moisture when wearing socks and shoes.   Wear sandals at home to avoid spreading to close contacts and to allow feet to air dry. Follow up with PCP for further evaluation if symptoms persists Return or go to the ER if you have any new or worsening symptoms such as fever, chills, nausea, vomiting, redness, swelling, discharge, if symptoms do not improve with medications, etc...Marland Kitchen

## 2018-05-21 NOTE — ED Provider Notes (Signed)
[facility]   [id number] 05/21/18 Arrival Time: 1025   CC: URI symptoms and rash on feet  SUBJECTIVE: History from: patient.  Jacob Barber is a 33 y.o. male who presents with abrupt onset of nasal congestion, runny nose, sinus pain/pressure, and productive cough x 4 days.  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  Has tried OTC medications including sudafed and zycam with minimal relief.  Symptoms made worse with at night.  Denies previous symptoms in the past.   Complains of associated fatigue, PND, and sore throat.  Denies fever, chills, SOB, wheezing, chest pain, nausea, changes in bowel or bladder habits.    Pt also mentions rash on bottom of feet x 1 week.  Denies known injury.  Does admit to sweating in socks.  Describes as itchy/ uncomfortable. Worse in the AM.  Has tried neosporin without noticeable relief.  Reports previous symptoms with athletes foot.    ROS: As per HPI.  Past Medical History:  Diagnosis Date  . Childhood asthma   . GERD (gastroesophageal reflux disease)    Past Surgical History:  Procedure Laterality Date  . ESOPHAGOGASTRODUODENOSCOPY N/A 05/08/2016   Procedure: ESOPHAGOGASTRODUODENOSCOPY (EGD);  Surgeon: Malissa HippoNajeeb U Rehman, MD;  Location: [facility];  Service: Endoscopy;  Laterality: N/A;  11:55   Allergies  Allergen Reactions  . Erythromycin     hives  . Sulfa Antibiotics     hives   No current facility-administered medications on file prior to encounter.    No current outpatient medications on file prior to encounter.   Social History   Socioeconomic History  . Marital status: Single    Spouse name: Not on file  . Number of children: Not on file  . Years of education: Not on file  . Highest education level: Not on file  Occupational History  . Not on file  Social Needs  . Financial resource strain: Not on file  . Food insecurity:    Worry: Not on file    Inability: Not on file    . Transportation needs:    Medical: Not on file    Non-medical: Not on file  Tobacco Use  . Smoking status: Light Tobacco Smoker    Types: Cigarettes    Start date: 01/31/2012  . Smokeless tobacco: Never Used  . Tobacco comment: just on weekends  Substance and Sexual Activity  . Alcohol use: Yes    Comment: Occasional  . Drug use: No  . Sexual activity: Not on file  Lifestyle  . Physical activity:    Days per week: Not on file    Minutes per session: Not on file  . Stress: Not on file  Relationships  . Social connections:    Talks on phone: Not on file    Gets together: Not on file    Attends religious service: Not on file    Active member of club or organization: Not on file    Attends meetings of clubs or organizations: Not on file    Relationship status: Not on file  . Intimate partner violence:    Fear of current or ex partner: Not on file    Emotionally abused: Not on file    Physically abused: Not on file    Forced sexual activity: Not on file  Other Topics Concern  . Not on file  Social History Narrative  . Not on file   Family History  Problem  Relation Age of Onset  . CAD Father        Diagnosed in his 21s  . Sleep apnea Father     OBJECTIVE:  Vitals:   05/21/18 1137  BP: (!) 144/85  Pulse: 82  Resp: 16  Temp: 98.4 F (36.9 C)  TempSrc: Oral  SpO2: 97%     General appearance: alert; appears mildly fatigued, but nontoxic; speaking in full sentences and tolerating own secretions HEENT: NCAT; Ears: EACs clear, TMs pearly gray; Eyes: PERRL.  EOM grossly intact. Sinuses: nontender; Nose: nares patent with mild rhinorrhea, turbinates swollen and erythematous, Throat: oropharynx clear, tonsils non erythematous or enlarged, uvula midline  Neck: supple without LAD Lungs: unlabored respirations, symmetrical air entry; cough: mild; no respiratory distress; CTAB Heart: regular rate and rhythm.  Radial pulses 2+ symmetrical bilaterally Skin: warm and dry;  few small hyperpigemented slightly raised lesions to plantar aspect of the ball of the feet; NTTP; no drainage or bleeding; blanches with pressure Psychological: alert and cooperative; normal mood and affect  ASSESSMENT & PLAN:  1. Viral URI with cough   2. Tinea pedis of both feet     Meds ordered this encounter  Medications  . fluticasone (FLONASE) 50 MCG/ACT nasal spray    Sig: Place 2 sprays into both nostrils daily.    Dispense:  16 g    Refill:  0    Order Specific Question:   Supervising Provider    Answer:   Eustace Moore [[id number]]  . cetirizine-pseudoephedrine (ZYRTEC-D) 5-120 MG tablet    Sig: Take 1 tablet by mouth daily.    Dispense:  30 tablet    Refill:  0    Order Specific Question:   Supervising Provider    Answer:   Eustace Moore [[id number]]  . oxymetazoline (AFRIN NASAL SPRAY) 0.05 % nasal spray    Sig: Place 1 spray into both nostrils 2 (two) times daily.    Dispense:  30 mL    Refill:  0    Order Specific Question:   Supervising Provider    Answer:   Eustace Moore [[id number]]  . tolnaftate (TINACTIN) 1 % cream    Sig: Apply 1 application topically 2 (two) times daily.    Dispense:  30 g    Refill:  0    Order Specific Question:   Supervising Provider    Answer:   Eustace Moore [[id number]]    Get plenty of rest and push fluids Continue with tessalon perles as needed for cough Discontinue sudafed and begin taking zyrtec-D prescribed for nasal congestion, runny nose, and/or sore throat Flonase prescribed for nasal congestion and runny nose Use medications daily for symptom relief Afrin as needed for nasal congestion.  Do NOT take longer than 3 days or you may experience rebound congestion Use OTC medications like ibuprofen or tylenol as needed fever or pain Follow up with PCP if symptoms persist Return or go to ER if you have any new or worsening symptoms fever, chills, nausea, vomiting, chest pain, cough, shortness of breath, wheezing,  abdominal pain, changes in bowel or bladder habits, etc...  Prescribed tinactin cream use twice daily for at least 4 weeks Keep feet dry.  You may try using baby powder to help absorb moisture when wearing socks and shoes.   Wear sandals at home to avoid spreading to close contacts and to allow feet to air dry. Follow up with PCP for further evaluation if symptoms persists Return or go to  the ER if you have any new or worsening symptoms such as fever, chills, nausea, vomiting, redness, swelling, discharge, if symptoms do not improve with medications, etc...  Reviewed expectations re: course of current medical issues. Questions answered. Outlined signs and symptoms indicating need for more acute intervention. Patient verbalized understanding. After Visit Summary given.         Rennis HardingWurst, Abdirizak Richison, PA-C 05/21/18 1501

## 2019-03-02 ENCOUNTER — Other Ambulatory Visit: Payer: Self-pay | Admitting: Internal Medicine

## 2019-03-02 ENCOUNTER — Telehealth: Payer: No Typology Code available for payment source | Admitting: Physician Assistant

## 2019-03-02 DIAGNOSIS — U071 COVID-19: Secondary | ICD-10-CM

## 2019-03-02 DIAGNOSIS — R1084 Generalized abdominal pain: Secondary | ICD-10-CM

## 2019-03-02 NOTE — Progress Notes (Signed)
Based on what you shared with me, I feel your condition warrants further evaluation and I recommend that you be seen for a face to face office visit. Giving worsening symptoms I do agree a lung examination and chest x-ray are a good idea. This is not something we do via e-visit. I will need you to either contact your PCP to arrange a chest x-ray or this can be done at any of our [facility] centers.   NOTE: If you entered your credit card information for this eVisit, you will not be charged. You may see a "hold" on your card for the $35 but that hold will drop off and you will not have a charge processed.  If you are having a true medical emergency please call 911.     For an urgent face to face visit, [facility] has four urgent care centers for your convenience:    NEW:  [facility]   https://www.google.com/maps/dir/?[email or web address]&destination=[facility]%2c+[address]+[place]%2c+[place]+[address]                                                   [place], [place] [address] .  Monday - Friday 10 am - 6 pm    . [facility]    [phone]                  Get Driving Directions  [address] [place], [place] [address] . 10 am to 8 pm Monday-Friday . 12 pm to 8 pm Saturday-Sunday   . [facility] [place]                  Get Driving Directions  [address], [place], [place] [address] . 8 am to 8 pm Monday-Friday . 9 am to 6 pm Saturday . 11 am to 6 pm Sunday     . [facility] [place]                  Get Driving Directions   [address] [place], [place] [address] . 8 am to 8 pm Monday-Friday . 8 am to 4 pm Saturday-Sunday    . [facility] [place]                    Get Driving Directions  [phone]  [address], [place], [place] [address]  . Monday-Friday, 12 PM to 6 PM    Your e-visit answers were reviewed by a board certified advanced clinical practitioner to complete your personal care plan.  Thank you for using e-Visits.

## 2019-03-03 ENCOUNTER — Ambulatory Visit (INDEPENDENT_AMBULATORY_CARE_PROVIDER_SITE_OTHER): Payer: No Typology Code available for payment source

## 2019-03-03 ENCOUNTER — Ambulatory Visit
Admission: EM | Admit: 2019-03-03 | Discharge: 2019-03-03 | Disposition: A | Discharge status: Home / Self Care | Payer: No Typology Code available for payment source | Attending: Internal Medicine | Admitting: Internal Medicine

## 2019-03-03 ENCOUNTER — Other Ambulatory Visit: Payer: Self-pay

## 2019-03-03 DIAGNOSIS — R509 Fever, unspecified: Secondary | ICD-10-CM | POA: Diagnosis not present

## 2019-03-03 DIAGNOSIS — R05 Cough: Secondary | ICD-10-CM | POA: Diagnosis not present

## 2019-03-03 DIAGNOSIS — U071 COVID-19: Secondary | ICD-10-CM

## 2019-03-03 DIAGNOSIS — R059 Cough, unspecified: Secondary | ICD-10-CM

## 2019-03-03 DIAGNOSIS — R5081 Fever presenting with conditions classified elsewhere: Secondary | ICD-10-CM

## 2019-03-03 MED ORDER — ALBUTEROL SULFATE HFA 108 (90 BASE) MCG/ACT IN AERS: 2.0000 | INHALATION_SPRAY | RESPIRATORY_TRACT | 0 refills | Status: DC | PRN

## 2019-03-03 MED ORDER — BENZONATATE 100 MG PO CAPS: 100.0000 mg | ORAL_CAPSULE | Freq: Three times a day (TID) | ORAL | 0 refills | Status: DC

## 2019-03-03 NOTE — ED Provider Notes (Signed)
[facility]    CSN: [id number] Arrival date & time: 03/03/19  1319      History   Chief Complaint Chief Complaint  Patient presents with  . persistent covid cough, fevers    HPI Jacob Barber is a 33 y.o. male.   The history is provided by the patient. No language interpreter was used.  URI Presenting symptoms: congestion, cough, facial pain, fatigue and fever   Presenting symptoms: no ear pain and no sore throat   Severity:  Moderate Onset quality:  Gradual Duration:  13 days Timing:  Constant Progression:  Unable to specify Chronicity:  Recurrent Relieved by:  OTC medications Worsened by:  Nothing Ineffective treatments:  OTC medications Associated symptoms: headaches   Associated symptoms: no sinus pain   Risk factors: sick contacts     Past Medical History:  Diagnosis Date  . Childhood asthma   . GERD (gastroesophageal reflux disease)     Patient Active Problem List   Diagnosis Date Noted  . Other chest pain 04/29/2016  . SOB (shortness of breath) 01/31/2016  . Asthma in adult 01/31/2016  . Chest pain 01/31/2016  . Fatigue 01/31/2016  . GERD (gastroesophageal reflux disease) 01/30/2016    Past Surgical History:  Procedure Laterality Date  . ESOPHAGOGASTRODUODENOSCOPY N/A 05/08/2016   Procedure: ESOPHAGOGASTRODUODENOSCOPY (EGD);  Surgeon: Malissa Hippo, MD;  [facility];  Service: Endoscopy;  Laterality: N/A;  11:55       Home Medications    Prior to Admission medications   Medication Sig Start Date End Date Taking? Authorizing Provider  albuterol (VENTOLIN HFA) 108 (90 Base) MCG/ACT inhaler Inhale 2 puffs into the lungs every 4 (four) hours as needed for wheezing or shortness of breath. 03/03/19   Annett Boxwell, Zachery Dakins, FNP  benzonatate (TESSALON) 100 MG capsule Take 1 capsule (100 mg total) by mouth every 8 (eight) hours. 03/03/19   Aimie Wagman, Zachery Dakins, FNP  cetirizine-pseudoephedrine (ZYRTEC-D) 5-120 MG tablet Take 1  tablet by mouth daily. 05/21/18   Wurst, [place], PA-C  fluticasone (FLONASE) 50 MCG/ACT nasal spray Place 2 sprays into both nostrils daily. 05/21/18   Wurst, [place], PA-C  oxymetazoline (AFRIN NASAL SPRAY) 0.05 % nasal spray Place 1 spray into both nostrils 2 (two) times daily. 05/21/18   Wurst, [place], PA-C  tolnaftate (TINACTIN) 1 % cream Apply 1 application topically 2 (two) times daily. 05/21/18   Rennis Harding, PA-C    Family History Family History  Problem Relation Age of Onset  . CAD Father        Diagnosed in his 6s  . Sleep apnea Father   . Healthy Sister     Social History Social History   Tobacco Use  . Smoking status: Former Smoker    Types: Cigarettes    Start date: 01/31/2012  . Smokeless tobacco: Never Used  . Tobacco comment: just on weekends  Substance Use Topics  . Alcohol use: Yes    Comment: Occasional  . Drug use: No     Allergies   Erythromycin and Sulfa antibiotics   Review of Systems Review of Systems  Constitutional: Positive for chills, fatigue and fever. Negative for activity change and appetite change.  HENT: Positive for congestion. Negative for ear pain, sinus pressure, sinus pain, sore throat and trouble swallowing.   Respiratory: Positive for cough. Negative for chest tightness and shortness of breath.   Cardiovascular: Negative for chest pain and palpitations.  Gastrointestinal: Negative for abdominal pain and nausea.  Neurological:  Positive for headaches.     Physical Exam Triage Vital Signs ED Triage Vitals  Enc Vitals Group     BP 03/03/19 1330 (!) 147/82     Pulse Rate 03/03/19 1330 78     Resp 03/03/19 1330 16     Temp 03/03/19 1330 98.5 F (36.9 C)     Temp Source 03/03/19 1330 Oral     SpO2 03/03/19 1330 96 %     Weight --      Height --      Head Circumference --      Peak Flow --      Pain Score 03/03/19 1334 6     Pain Loc --      Pain Edu? --      Excl. in [place]? --    No data found.  Updated Vital Signs  BP (!) 147/82 (BP Location: Right Arm)   Pulse 78   Temp 98.5 F (36.9 C) (Oral)   Resp 16   SpO2 96%   Visual Acuity Right Eye Distance:   Left Eye Distance:   Bilateral Distance:    Right Eye Near:   Left Eye Near:    Bilateral Near:     Physical Exam Vitals signs and nursing note reviewed.  Constitutional:      General: He is not in acute distress.    Appearance: Normal appearance. He is obese. He is not ill-appearing or toxic-appearing.  HENT:     Right Ear: Tympanic membrane normal.     Left Ear: Tympanic membrane normal.     Nose: No congestion.     Right Sinus: No frontal sinus tenderness.     Left Sinus: No frontal sinus tenderness.     Mouth/Throat:     Mouth: Mucous membranes are moist.     Tongue: No lesions.     Palate: No mass.     Pharynx: Oropharynx is clear. Uvula midline. No pharyngeal swelling or oropharyngeal exudate.     Tonsils: No tonsillar exudate or tonsillar abscesses.  Cardiovascular:     Rate and Rhythm: Normal rate and regular rhythm.     Pulses: Normal pulses.     Heart sounds: Normal heart sounds. No murmur.  Pulmonary:     Effort: Pulmonary effort is normal. No respiratory distress.     Breath sounds: Normal breath sounds. No stridor. No wheezing or rhonchi.  Chest:     Chest wall: No tenderness.  Neurological:     Mental Status: He is alert and oriented to person, place, and time.      UC Treatments / Results  Labs (all labs ordered are listed, but only abnormal results are displayed) Labs Reviewed - No data to display  EKG   Radiology Dg Chest 2 View  Result Date: 03/03/2019 CLINICAL DATA:  Persistent cough, COVID-19 positive. EXAM: CHEST - 2 VIEW COMPARISON:  None. FINDINGS: The heart size and mediastinal contours are within normal limits. Both lungs are clear. The visualized skeletal structures are unremarkable. IMPRESSION: No active cardiopulmonary disease. Electronically Signed   By: Marijo Conception M.D.   On:  03/03/2019 14:16    Procedures Procedures (including critical care time)  Medications Ordered in UC Medications - No data to display  Initial Impression / Assessment and Plan / UC Course  I have reviewed the triage vital signs and the nursing notes.  Pertinent labs & imaging results that were available during my care of the patient were reviewed by me and  considered in my medical decision making (see chart for details).  Xray show show no active cardiopulmonary disease. Patient was advised to quarantine and to return to clinic if symptom get worse. Albuterol and tessalon were prescribe for symptom management Clinical Course as of Mar 02 1433  Fri Mar 03, 2019  1418 DG Chest 2 View [KA]  1418 DG Chest 2 View [KA]    Clinical Course User Index [KA] Durward ParcelAvegno, Sheree Lalla S, FNP   Final Clinical Impressions(s) / UC Diagnoses   Final diagnoses:  COVID-19 virus infection  Cough  Fever due to COVID-19        Discharge Instructions     Your chest X-ray show no active cardiopulmonary disease. Our test for COVID-19 was positive, meaning that you were infected with the novel coronavirus and could give the germ to others.   Please continue isolation at home, for at least 10 days since the start of your fever/cough/breathlessness and until you have had 3 consecutive days without fever (without taking a fever reducer) and with cough/breathlessness improving. Please continue good preventive care measures, including:  frequent hand-washing, avoid touching your face, cover coughs/sneezes, stay out of crowds and keep a 6 foot distance from others. Recheck or go to the nearest hospital ED for re-assessment if fever/cough/breathlessness return.  Continue OTC Ibuprofen for to manage fever Take Albuterol and tessalon as prescribed     ED Prescriptions    Medication Sig Dispense Auth. Provider   albuterol (VENTOLIN HFA) 108 (90 Base) MCG/ACT inhaler Inhale 2 puffs into the lungs every 4 (four) hours  as needed for wheezing or shortness of breath. 8 g Nasiah Polinsky, Zachery DakinsKomlanvi S, FNP   benzonatate (TESSALON) 100 MG capsule Take 1 capsule (100 mg total) by mouth every 8 (eight) hours. 21 capsule Carlotta Telfair, Zachery DakinsKomlanvi S, FNP     PDMP not reviewed this encounter.   Durward Parcelvegno, Blayn Whetsell S, FNP 03/03/19 1435

## 2019-03-03 NOTE — Discharge Instructions (Addendum)
Your chest X-ray show no active cardiopulmonary disease. Our test for COVID-19 was positive, meaning that you were infected with the novel coronavirus and could give the germ to others.   Please continue isolation at home, for at least 10 days since the start of your fever/cough/breathlessness and until you have had 3 consecutive days without fever (without taking a fever reducer) and with cough/breathlessness improving. Please continue good preventive care measures, including:  frequent hand-washing, avoid touching your face, cover coughs/sneezes, stay out of crowds and keep a 6 foot distance from others. Recheck or go to the nearest hospital ED for re-assessment if fever/cough/breathlessness return.  Continue OTC Ibuprofen for to manage fever Take Albuterol and tessalon as prescribed

## 2019-03-03 NOTE — ED Triage Notes (Signed)
Pt presents to UC w/ c/o fevers on and off, persistent cough, headache since 2 weeks. Pt tested positive for covid on 11/3 and symptoms began on 11/1. Pt had an e-visit and states they sent him here for chest xray.

## 2019-03-20 ENCOUNTER — Other Ambulatory Visit: Payer: Self-pay

## 2019-03-20 ENCOUNTER — Ambulatory Visit (HOSPITAL_COMMUNITY)
Admission: RE | Admit: 2019-03-20 | Discharge: 2019-03-20 | Disposition: A | Discharge status: Home / Self Care | Payer: No Typology Code available for payment source | Source: Ambulatory Visit | Attending: Internal Medicine | Admitting: Internal Medicine

## 2019-03-20 DIAGNOSIS — R1084 Generalized abdominal pain: Secondary | ICD-10-CM | POA: Diagnosis not present

## 2019-03-20 MED ORDER — IOHEXOL 300 MG/ML  SOLN
100.0000 mL | Freq: Once | INTRAMUSCULAR | Status: AC | PRN
  Administered 2019-03-20: 100 mL via INTRAVENOUS

## 2019-06-02 ENCOUNTER — Emergency Department (HOSPITAL_COMMUNITY): Payer: No Typology Code available for payment source

## 2019-06-02 ENCOUNTER — Encounter (HOSPITAL_COMMUNITY): Payer: Self-pay | Admitting: Student

## 2019-06-02 ENCOUNTER — Other Ambulatory Visit: Payer: Self-pay

## 2019-06-02 ENCOUNTER — Emergency Department (HOSPITAL_COMMUNITY)
Admission: EM | Admit: 2019-06-02 | Discharge: 2019-06-02 | Disposition: A | Discharge status: Home / Self Care | Payer: No Typology Code available for payment source | Attending: Emergency Medicine | Admitting: Emergency Medicine

## 2019-06-02 DIAGNOSIS — Z87891 Personal history of nicotine dependence: Secondary | ICD-10-CM | POA: Insufficient documentation

## 2019-06-02 DIAGNOSIS — J45909 Unspecified asthma, uncomplicated: Secondary | ICD-10-CM | POA: Diagnosis not present

## 2019-06-02 DIAGNOSIS — Z79899 Other long term (current) drug therapy: Secondary | ICD-10-CM | POA: Diagnosis not present

## 2019-06-02 DIAGNOSIS — R945 Abnormal results of liver function studies: Secondary | ICD-10-CM | POA: Diagnosis not present

## 2019-06-02 DIAGNOSIS — R7989 Other specified abnormal findings of blood chemistry: Secondary | ICD-10-CM

## 2019-06-02 DIAGNOSIS — R0789 Other chest pain: Secondary | ICD-10-CM | POA: Diagnosis present

## 2019-06-02 DIAGNOSIS — R079 Chest pain, unspecified: Secondary | ICD-10-CM

## 2019-06-02 LAB — TROPONIN I (HIGH SENSITIVITY)
Troponin I (High Sensitivity): <2 ng/L (ref <18)
Troponin I (High Sensitivity): <2 ng/L (ref <18)

## 2019-06-02 LAB — COMPREHENSIVE METABOLIC PANEL
ALT: 114 U/L — ABNORMAL HIGH (ref 0–44)
AST: 55 U/L — ABNORMAL HIGH (ref 15–41)
Albumin: 4.5 g/dL (ref 3.5–5.0)
Alkaline Phosphatase: 48 U/L (ref 38–126)
Anion gap: 9 (ref 5–15)
BUN: 11 mg/dL (ref 6–20)
CO2: 22 mmol/L (ref 22–32)
Calcium: 9.4 mg/dL (ref 8.9–10.3)
Chloride: 106 mmol/L (ref 98–111)
Creatinine, Ser: 1.1 mg/dL (ref 0.61–1.24)
GFR calc Af Amer: >60 mL/min (ref >60)
GFR calc non Af Amer: >60 mL/min (ref >60)
Glucose, Bld: 108 mg/dL — ABNORMAL HIGH (ref 70–99)
Potassium: 3.5 mmol/L (ref 3.5–5.1)
Sodium: 137 mmol/L (ref 135–145)
Total Bilirubin: 0.9 mg/dL (ref 0.3–1.2)
Total Protein: 7.5 g/dL (ref 6.5–8.1)

## 2019-06-02 LAB — LIPASE, BLOOD: Lipase: 30 U/L (ref 11–51)

## 2019-06-02 LAB — CBC WITH DIFFERENTIAL/PLATELET
Abs Immature Granulocytes: 0.03 10*3/uL (ref 0.00–0.07)
Basophils Absolute: 0.1 10*3/uL (ref 0.0–0.1)
Basophils Relative: 1 %
Eosinophils Absolute: 0.2 10*3/uL (ref 0.0–0.5)
Eosinophils Relative: 2 %
HCT: 47.3 % (ref 39.0–52.0)
Hemoglobin: 16.1 g/dL (ref 13.0–17.0)
Immature Granulocytes: 0 %
Lymphocytes Relative: 37 %
Lymphs Abs: 2.5 10*3/uL (ref 0.7–4.0)
MCH: 30 pg (ref 26.0–34.0)
MCHC: 34 g/dL (ref 30.0–36.0)
MCV: 88.2 fL (ref 80.0–100.0)
Monocytes Absolute: 0.5 10*3/uL (ref 0.1–1.0)
Monocytes Relative: 8 %
Neutro Abs: 3.5 10*3/uL (ref 1.7–7.7)
Neutrophils Relative %: 52 %
Platelets: 216 10*3/uL (ref 150–400)
RBC: 5.36 MIL/uL (ref 4.22–5.81)
RDW: 12.4 % (ref 11.5–15.5)
WBC: 6.7 10*3/uL (ref 4.0–10.5)
nRBC: 0 % (ref 0.0–0.2)

## 2019-06-02 LAB — D-DIMER, QUANTITATIVE: D-Dimer, Quant: <0.27 ug/mL-FEU (ref 0.00–0.50)

## 2019-06-02 MED ORDER — LORAZEPAM 2 MG/ML IJ SOLN
0.5000 mg | Freq: Once | INTRAMUSCULAR | Status: AC
  Administered 2019-06-02: 16:00:00 0.5 mg via INTRAVENOUS
  Filled 2019-06-02: qty 1

## 2019-06-02 MED ORDER — SODIUM CHLORIDE 0.9 % IV BOLUS
1000.0000 mL | Freq: Once | INTRAVENOUS | Status: AC
  Administered 2019-06-02: 1000 mL via INTRAVENOUS

## 2019-06-02 MED ORDER — MORPHINE SULFATE (PF) 4 MG/ML IV SOLN: 4.0000 mg | Freq: Once | INTRAVENOUS | Status: DC

## 2019-06-02 MED ORDER — ONDANSETRON HCL 4 MG/2ML IJ SOLN: 4.0000 mg | Freq: Once | INTRAMUSCULAR | Status: DC

## 2019-06-02 MED ORDER — HYDROXYZINE HCL 10 MG PO TABS: 10.0000 mg | ORAL_TABLET | Freq: Three times a day (TID) | ORAL | 0 refills | Status: DC | PRN

## 2019-06-02 MED ORDER — LORAZEPAM 2 MG/ML IJ SOLN
INTRAMUSCULAR | Status: AC
  Filled 2019-06-02: qty 1

## 2019-06-02 NOTE — ED Provider Notes (Signed)
[facility] EMERGENCY DEPARTMENT Provider Note   CSN: [id number] Arrival date & time: 06/02/19  1443     History Chief Complaint  Patient presents with  . Chest Pain    Jacob Barber is a 34 y.o. male with a history of GERD & childhood asthma who presents to the ED via EMS with complaints of chest discomfort that began shortly PTA. Patient states that he was in [place] shopping for his girlfriend for valentines day when he developed substernal non radiating chest pressure/tightness but not particularly pain, states it seemed to be progressively worsening with associated shortness of breath & lightheadedness. He went to the pharmacy area where they checked his BP and HR and he was told these were each elevated. 911 was called. He was given NTG & aspirin en route. He states nothing specifically seems to make the chest discomfort better/worse. He has a hx of similar discomfort several years ago that was less severe than sxs today- saw cardiology & GI.  Denies fever, chills, URI symptoms, cough, syncope, vomiting, leg pain/swelling, hemoptysis, recent surgery/trauma, recent long travel, hormone use, personal hx of cancer, or hx of DVT/PE. Per EMS initially was tachycardic in the 120s. His father had an MI in his 61s. He had COVID this past fall.    HPI     Past Medical History:  Diagnosis Date  . Childhood asthma   . GERD (gastroesophageal reflux disease)     Patient Active Problem List   Diagnosis Date Noted  . Other chest pain 04/29/2016  . SOB (shortness of breath) 01/31/2016  . Asthma in adult 01/31/2016  . Chest pain 01/31/2016  . Fatigue 01/31/2016  . GERD (gastroesophageal reflux disease) 01/30/2016    Past Surgical History:  Procedure Laterality Date  . ESOPHAGOGASTRODUODENOSCOPY N/A 05/08/2016   Procedure: ESOPHAGOGASTRODUODENOSCOPY (EGD);  Surgeon: Malissa Hippo, MD;  [facility]: [facility];  Service: Endoscopy;  Laterality: N/A;  11:55       Family History    Problem Relation Age of Onset  . CAD Father        Diagnosed in his 48s  . Sleep apnea Father   . Healthy Sister     Social History   Tobacco Use  . Smoking status: Former Smoker    Types: Cigarettes    Start date: 01/31/2012  . Smokeless tobacco: Never Used  . Tobacco comment: just on weekends  Substance Use Topics  . Alcohol use: Yes    Comment: Occasional  . Drug use: No    Home Medications Prior to Admission medications   Medication Sig Start Date End Date Taking? Authorizing Provider  albuterol (VENTOLIN HFA) 108 (90 Base) MCG/ACT inhaler Inhale 2 puffs into the lungs every 4 (four) hours as needed for wheezing or shortness of breath. 03/03/19   Avegno, Zachery Dakins, FNP  benzonatate (TESSALON) 100 MG capsule Take 1 capsule (100 mg total) by mouth every 8 (eight) hours. 03/03/19   Avegno, Zachery Dakins, FNP  cetirizine-pseudoephedrine (ZYRTEC-D) 5-120 MG tablet Take 1 tablet by mouth daily. 05/21/18   Wurst, [place], PA-C  fluticasone (FLONASE) 50 MCG/ACT nasal spray Place 2 sprays into both nostrils daily. 05/21/18   Wurst, [place], PA-C  oxymetazoline (AFRIN NASAL SPRAY) 0.05 % nasal spray Place 1 spray into both nostrils 2 (two) times daily. 05/21/18   Wurst, [place], PA-C  tolnaftate (TINACTIN) 1 % cream Apply 1 application topically 2 (two) times daily. 05/21/18   Rennis Harding, PA-C    Allergies  Erythromycin and Sulfa antibiotics  Review of Systems   Review of Systems  Constitutional: Negative for chills and fever.  HENT: Negative for congestion, ear pain and sore throat.   Respiratory: Positive for chest tightness and shortness of breath.   Cardiovascular: Negative for leg swelling.  Gastrointestinal: Negative for abdominal pain and vomiting.  Neurological: Positive for light-headedness. Negative for syncope, weakness, numbness and headaches.  All other systems reviewed and are negative.   Physical Exam Updated Vital Signs BP (!) 150/94 (BP Location: Right  Arm)   Pulse 83   Temp 97.7 F (36.5 C) (Oral)   Resp 20   Ht 6\' 1"  (1.854 m)   Wt 97.5 kg   SpO2 100%   BMI 28.37 kg/m   Physical Exam Vitals and nursing note reviewed.  Constitutional:      General: He is not in acute distress.    Appearance: He is well-developed. He is not toxic-appearing.  HENT:     Head: Normocephalic and atraumatic.  Eyes:     General:        Right eye: No discharge.        Left eye: No discharge.     Conjunctiva/sclera: Conjunctivae normal.  Cardiovascular:     Rate and Rhythm: Normal rate and regular rhythm.     Pulses:          Radial pulses are 2+ on the right side and 2+ on the left side.  Pulmonary:     Effort: Pulmonary effort is normal. No respiratory distress.     Breath sounds: Normal breath sounds. No wheezing, rhonchi or rales.  Abdominal:     General: There is no distension.     Palpations: Abdomen is soft.     Tenderness: There is no abdominal tenderness.  Musculoskeletal:     Cervical back: Neck supple.     Right lower leg: No tenderness. No edema.     Left lower leg: No tenderness. No edema.  Skin:    General: Skin is warm and dry.     Findings: No rash.  Neurological:     Mental Status: He is alert.     Comments: Clear speech.   Psychiatric:        Mood and Affect: Mood is anxious.        Behavior: Behavior normal.     ED Results / Procedures / Treatments   Labs (all labs ordered are listed, but only abnormal results are displayed) Labs Reviewed  COMPREHENSIVE METABOLIC PANEL - Abnormal; Notable for the following components:      Result Value   Glucose, Bld 108 (*)    AST 55 (*)    ALT 114 (*)    All other components within normal limits  LIPASE, BLOOD  CBC WITH DIFFERENTIAL/PLATELET  D-DIMER, QUANTITATIVE (NOT AT [facility])  TROPONIN I (HIGH SENSITIVITY)  TROPONIN I (HIGH SENSITIVITY)    EKG None  Date: 06/02/2019  Rate: 81 bpm  Rhythm: normal sinus rhythm  QRS Axis: normal  Intervals: normal  ST/T Wave  abnormalities: normal  Conduction Disutrbances: none  Narrative Interpretation: no STEMI   Radiology DG Chest 2 View  Result Date: 06/02/2019 CLINICAL DATA:  Chest Pain today. EXAM: CHEST - 2 VIEW COMPARISON:  Mar 03, 2019 FINDINGS: The heart size and mediastinal contours are within normal limits. Both lungs are clear. The visualized skeletal structures are unremarkable. IMPRESSION: No active cardiopulmonary disease. Electronically Signed   By: Abelardo Diesel M.D.   On: 06/02/2019  15:58    Procedures Procedures (including critical care time)  Medications Ordered in ED Medications  sodium chloride 0.9 % bolus 1,000 mL (has no administration in time range)  LORazepam (ATIVAN) injection 0.5 mg (has no administration in time range)    ED Course  I have reviewed the triage vital signs and the nursing notes.  Pertinent labs & imaging results that were available during my care of the patient were reviewed by me and considered in my medical decision making (see chart for details).    Jacob Barber was evaluated in Emergency Department on 06/02/2019 for the symptoms described in the history of present illness. He/she was evaluated in the context of the global COVID-19 pandemic, which necessitated consideration that the patient might be at risk for infection with the SARS-CoV-2 virus that causes COVID-19. Institutional protocols and algorithms that pertain to the evaluation of patients at risk for COVID-19 are in a state of rapid change based on information released by regulatory bodies including the CDC and federal and state organizations. These policies and algorithms were followed during the patient's care in the ED.  MDM Rules/Calculators/A&P                     Patient presents to the emergency department with chest pain. Patient nontoxic appearing, in no apparent distress, vitals without significant abnormality, BP elevated. Patient noted to be anxious, fairly benign physical exam.   Per  chart review:Seen by cardiology October 2017, had stress echo performed which was a normal study after maximal exercise. Subsequently saw GI, had upper endoscopy 04/2016 which showed a normal esophagus, stomach, duodenal bulb & second portion of the duodenum.   DDX: ACS, pulmonary embolism, dissection, pneumothorax, pneumonia, arrhythmia, severe anemia, MSK, GERD, anxiety. Evaluation initiated with labs, EKG, and CXR. Patient on cardiac monitor.   CBC: No anemia/leukocytosis.  CMP: Mildly elevated LFTs- chart review from cardiology note in 2017 note mentions AST 34 & ALT 70- patient states occasional alcohol use not daily or in excess, no frequent or excess tylenol use, no recent travel. Abdomen nontender w/o peritoneal signs- GI/PCP follow up. No electrolyte derangement. Renal function WNL.  Lipase: WNL Troponin: <2, < 2 EKG: NSR. NO STEMI.  CXR:  Negative, without infiltrate, effusion, pneumothorax, or fracture/dislocation. Personally reviewed imaging and agree with radiologist interpretation.   [place] Pathway Score 2- EKG without obvious acute ischemia, delta troponin negative, doubt ACS. Patient is low risk wells, ddimer WNL, doubt pulmonary embolism. Pain is not a tearing sensation, symmetric pulses, no widening of mediastinum on CXR, doubt dissection. Cardiac monitor reviewed, no notable arrhythmias or tachycardia. Patient feeling much better s/p ativan. He states his girlfriend thinks this may be anxiety, will trial PRN atarax and have patient follow up with PCP as well as his previously seen gastroenterologist & cardiologist. Patient has appeared hemodynamically stable throughout ER visit and appears safe for discharge. I discussed results, treatment plan, need for PCP follow-up, and return precautions with the patient. Provided opportunity for questions, patient confirmed understanding and is in agreement with plan.   Final Clinical Impression(s) / ED Diagnoses Final diagnoses:  Chest pain,  unspecified type  Elevated LFTs    Rx / DC Orders ED Discharge Orders         Ordered    hydrOXYzine (ATARAX/VISTARIL) 10 MG tablet  Every 8 hours PRN     06/02/19 1817           Cherly Anderson, PA-C 06/02/19 1833  Bethann Berkshire, MD 06/04/19 [phone]

## 2019-06-02 NOTE — ED Notes (Signed)
Dropped syringe with ativan on floor. Wasted with witness and drew up more with witness Dietitian.

## 2019-06-02 NOTE — Discharge Instructions (Addendum)
You were seen in the emergency department today for chest pain.  Your work-up was overall reassuring.  Your chest x-ray was normal.  Your EKG and heart enzymes did not show signs of a heart attack.  Your electrolytes were normal.  Your kidney and pancreas function were normal.  Your liver function tests were mildly elevated, these have been elevated somewhat in the past, please avoid alcohol and Tylenol use, please follow-up with your primary care provider and/or GI doctor for recheck of these.   We are sending you home with Atarax to take every 8 hours as needed for anxiety. We have prescribed you new medication(s) today. Discuss the medications prescribed today with your pharmacist as they can have adverse effects and interactions with your other medicines including over the counter and prescribed medications. Seek medical evaluation if you start to experience new or abnormal symptoms after taking one of these medicines, seek care immediately if you start to experience difficulty breathing, feeling of your throat closing, facial swelling, or rash as these could be indications of a more serious allergic reaction  Please follow-up with your primary care provider within 3 days for reevaluation.  You may also follow-up with your cardiologist or your GI doctor.  Return to the emergency department for new or worsening symptoms including but not limited to increased pain, passing out, trouble breathing, coughing up blood, or any other concerns.

## 2019-06-02 NOTE — ED Triage Notes (Signed)
Patient presents to the ED via [facility] for chest pain and rapid heart rate that began at 1400.  EMS reports HR 120.

## 2019-07-14 ENCOUNTER — Telehealth: Payer: Self-pay | Admitting: *Deleted

## 2019-07-14 ENCOUNTER — Ambulatory Visit (INDEPENDENT_AMBULATORY_CARE_PROVIDER_SITE_OTHER): Payer: No Typology Code available for payment source | Admitting: Cardiology

## 2019-07-14 ENCOUNTER — Encounter: Payer: Self-pay | Admitting: Cardiology

## 2019-07-14 VITALS — BP 135/83 | HR 74 | Temp 97.2°F | Ht 73.0 in | Wt 216.6 lb

## 2019-07-14 DIAGNOSIS — Z8249 Family history of ischemic heart disease and other diseases of the circulatory system: Secondary | ICD-10-CM | POA: Diagnosis not present

## 2019-07-14 DIAGNOSIS — R079 Chest pain, unspecified: Secondary | ICD-10-CM

## 2019-07-14 DIAGNOSIS — R072 Precordial pain: Secondary | ICD-10-CM

## 2019-07-14 MED ORDER — METOPROLOL TARTRATE 100 MG PO TABS: 100.0000 mg | ORAL_TABLET | Freq: Once | ORAL | 0 refills | Status: DC

## 2019-07-14 NOTE — Patient Instructions (Addendum)
Medication Instructions:    Your physician recommends that you continue on your current medications as directed. Please refer to the Current Medication list given to you today.  Labwork:  NONE  Testing/Procedures:  Cardiac CTA  Follow-Up:  Your physician recommends that you schedule a follow-up appointment in: pending.  Any Other Special Instructions Will Be Listed Below (If Applicable).  If you need a refill on your cardiac medications before your next appointment, please call your pharmacy.  Your cardiac CT will be scheduled at one of the below locations:   [facility] [address] [place], [place] [address] [phone]   If scheduled at [facility], please arrive at the [facility] main entrance of [facility] 30 minutes prior to test start time. Proceed to the [facility] Radiology Department (first floor) to check-in and test prep.   Please follow these instructions carefully (unless otherwise directed):  Hold all erectile dysfunction medications at least 3 days (72 hrs) prior to test.  On the Night Before the Test: . Be sure to Drink plenty of water. . Do not consume any caffeinated/decaffeinated beverages or chocolate 12 hours prior to your test. . Do not take any antihistamines 12 hours prior to your test.   On the Day of the Test: . Drink plenty of water. Do not drink any water within one hour of the test. . Do not eat any food 4 hours prior to the test. . You may take your regular medications prior to the test.  . -not given due to adult asthma       After the Test: . Drink plenty of water. . After receiving IV contrast, you may experience a mild flushed feeling. This is normal. . On occasion, you may experience a mild rash up to 24 hours after the test. This is not dangerous. If this occurs, you can take Benadryl 25 mg and increase your fluid intake. . If you experience trouble breathing, this can be serious. If it is  severe call 911 IMMEDIATELY. If it is mild, please call our office.  Once we have confirmed authorization from your insurance company, we will call you to set up a date and time for your test.   For non-scheduling related questions, please contact the cardiac imaging nurse navigator should you have any questions/concerns: Rockwell Alexandria, RN Navigator Cardiac Imaging [facility] [phone] office  For scheduling needs, including cancellations and rescheduling, please call [phone].

## 2019-07-14 NOTE — Progress Notes (Signed)
Cardiology Office Note  Date: 07/14/2019   ID: ASHKAN CHAMBERLAND, DOB 11/21/1985, MRN [id number]  PCP:  Celene Squibb, MD  Consulting Cardiologist:  Rozann Lesches, MD Electrophysiologist:  None   Chief Complaint  Patient presents with  . Chest Pain    History of Present Illness: Jacob Barber is a 34 y.o. male referred for cardiology consultation by Dr. Nevada Crane for the evaluation of chest pain.  I saw him back in 2017.  He presents reporting recurring episodes of chest tightness and shortness of breath, cannot pinpoint any specific precipitant however.  I reviewed his records, he was seen in the ER in February after an episode of chest discomfort. High-sensitivity troponin I levels were normal, D-dimer was normal, chest x-ray showed no acute findings, ECG showed no acute ST segment changes.  With that particular episode he had been shopping in [place] when the symptoms started, found his blood pressure to be very high at that time.  He has had episodes subsequent to that event, both occurred at rest and not in the setting of any particular emotional stress.  He has exercised without reproducing the symptoms.  He does have a family history of CAD, premature in his father.  No active trouble with wheezing, does not use the inhalers prescribed several years ago.  He does have a history of tobacco use but quit last year.  Also history of reflux, but reportedly unremarkable EGD, no active dysphagia.  He is a respiratory therapist, works at [employer].  Past Medical History:  Diagnosis Date  . Childhood asthma   . GERD (gastroesophageal reflux disease)     Past Surgical History:  Procedure Laterality Date  . ESOPHAGOGASTRODUODENOSCOPY N/A 05/08/2016   Procedure: ESOPHAGOGASTRODUODENOSCOPY (EGD);  Surgeon: Rogene Houston, MD;  [facility]: [facility];  Service: Endoscopy;  Laterality: N/A;  11:55    Current Outpatient Medications  Medication Sig Dispense Refill  . albuterol (VENTOLIN  HFA) 108 (90 Base) MCG/ACT inhaler Inhale 2 puffs into the lungs every 4 (four) hours as needed for wheezing or shortness of breath. 8 g 0  . fluticasone (FLONASE) 50 MCG/ACT nasal spray Place 2 sprays into both nostrils daily. 16 g 0  . traZODone (DESYREL) 50 MG tablet Take 1 tablet by mouth at bedtime as needed for sleep.    . hydrOXYzine (ATARAX/VISTARIL) 10 MG tablet Take 1-2 tablets (10-20 mg total) by mouth every 8 (eight) hours as needed for anxiety. (Patient not taking: Reported on 07/14/2019) 15 tablet 0   No current facility-administered medications for this visit.   Allergies:  Erythromycin and Sulfa antibiotics   Social History: The patient  reports that he quit smoking about 4 months ago. His smoking use included cigarettes. He started smoking about 7 years ago. He has never used smokeless tobacco. He reports current alcohol use. He reports that he does not use drugs.   Family History: The patient's family history includes CAD in his father; Healthy in his sister; Sleep apnea in his father.   ROS:  No wheezing, no palpitations or syncope.  Physical Exam: VS:  BP 135/83   Pulse 74   Temp (!) 97.2 F (36.2 C)   Ht 6\' 1"  (1.854 m)   Wt 216 lb 9.6 oz (98.2 kg)   SpO2 98%   BMI 28.58 kg/m , BMI Body mass index is 28.58 kg/m.  Wt Readings from Last 3 Encounters:  07/14/19 216 lb 9.6 oz (98.2 kg)  06/02/19 215 lb (  97.5 kg)  05/08/16 205 lb (93 kg)    General: Patient appears comfortable at rest. HEENT: Conjunctiva and lids normal, wearing a mask. Neck: Supple, no elevated JVP or carotid bruits, no thyromegaly. Lungs: Clear to auscultation, nonlabored breathing at rest. Cardiac: Regular rate and rhythm, no S3 or significant systolic murmur, no pericardial rub. Abdomen: Soft, bowel sounds present. Extremities: No pitting edema, distal pulses 2+. Skin: Warm and dry. Musculoskeletal: No kyphosis. Neuropsychiatric: Alert and oriented x3, affect grossly appropriate.  ECG:   An ECG dated 06/02/2019 was personally reviewed today and demonstrated:  Normal sinus rhythm.  Recent Labwork: 06/02/2019: ALT 114; AST 55; BUN 11; Creatinine, Ser 1.10; Hemoglobin 16.1; Platelets 216; Potassium 3.5; Sodium 137   Other Studies Reviewed Today:  Chest x-ray 06/02/2019: FINDINGS: The heart size and mediastinal contours are within normal limits. Both lungs are clear. The visualized skeletal structures are unremarkable.  IMPRESSION: No active cardiopulmonary disease.  Exercise echocardiogram 02/07/2016: Study Conclusions   - Stress ECG conclusions: The stress ECG was normal. [facility] scoring:  exercise time of 13 min; maximum ST deviation of 0 mm; no angina;  resulting score is 13. This score predicts a low risk of cardiac  events.  - Staged echo: Resting left ventricular systolic function was  normal, EF 55-60%. With stress, there was normal augmentation of  all wall segments with a hypercontractile response seen, EF 75%.  No inducible ischemia seen. Normal echo stress   Impressions:   - Normal study after maximal exercise.   Assessment and Plan:  Recurring episodes of chest discomfort and shortness of breath in a 34 year old male with family history of premature CAD in his father, previous tobacco use, GERD but no progressive reflux or dysphagia, no active or recurrent wheezing to suggest bronchospastic component.  Episodes are not predictable, generally atypical in description.  I reviewed his work-up from ER visit in February.  He did undergo an exercise echocardiogram back in 2017 that was reassuring.  We have discussed follow-up cardiac assessment via cardiac CTA.  Medication Adjustments/Labs and Tests Ordered: Current medicines are reviewed at length with the patient today.  Concerns regarding medicines are outlined above.   Tests Ordered: Orders Placed This Encounter  Procedures  . CT CORONARY MORPH W/CTA COR W/SCORE W/CA W/CM &/OR WO/CM  . CT  CORONARY FRACTIONAL FLOW RESERVE DATA PREP  . CT CORONARY FRACTIONAL FLOW RESERVE FLUID ANALYSIS    Medication Changes: No orders of the defined types were placed in this encounter.   Disposition:  Follow up test results.  Signed, Jonelle Sidle, MD, [facility] 07/14/2019 9:35 AM    [facility] at [facility] [address] [place], [place] [address] Phone: [phone]; Fax: [phone]

## 2019-07-14 NOTE — Telephone Encounter (Signed)
Patient has diagnosis of adult asthma but is asymptomatic.   Jacob Sidle, MD  Eustace Moore, RN  See my note. He does not report any wheezing, has not used any of the MDIs prescribed to him. I do not think he has a contraindication for beta-blocker.        Patient aware that metoprolol 100 mg will be sent to his pharmacy and he understanding to take it 2 hours before his cardiac cta.

## 2019-09-05 ENCOUNTER — Telehealth: Payer: Self-pay

## 2019-09-05 NOTE — Telephone Encounter (Signed)
-----   Message from Jonelle Sidle, MD sent at 09/05/2019  8:21 AM EDT ----- Regarding: RE: Cardiac CT apt cancelled Thank you.  If you can please let Dr. Scharlene Gloss office know that we saw the patient and recommended a cardiac CTA but have not been able to contact him. ----- Message ----- From: Nori Riis, RN Sent: 09/05/2019   8:19 AM EDT To: Jonelle Sidle, MD Subject: Cardiac CT apt cancelled                       ----- Message ----- From: Berle Mull Sent: 09/04/2019   9:43 AM EDT To: Eustace Moore, RN Subject: cardiac ct                                       Good morning we have tried reaching this patient several times with no response. We are cancelling the cardiac ct order at this time.    Thank you  Beacher May

## 2019-09-05 NOTE — Telephone Encounter (Signed)
I spoke with Jacob Barber at [facility] office who transferred me to extension [phone] and I left message regarding cancellation of cardiac CT

## 2021-03-17 ENCOUNTER — Telehealth: Payer: No Typology Code available for payment source | Admitting: Emergency Medicine

## 2021-03-17 DIAGNOSIS — J111 Influenza due to unidentified influenza virus with other respiratory manifestations: Secondary | ICD-10-CM

## 2021-03-17 MED ORDER — OSELTAMIVIR PHOSPHATE 75 MG PO CAPS: 75.0000 mg | ORAL_CAPSULE | Freq: Two times a day (BID) | ORAL | 0 refills | Status: AC

## 2021-03-17 NOTE — Progress Notes (Signed)
E visit for Flu like symptoms   We are sorry that you are not feeling well.  Here is how we plan to help! Based on what you have shared with me it looks like you may have a respiratory virus that may be influenza.  Influenza or "the flu" is   an infection caused by a respiratory virus. The flu virus is highly contagious and persons who did not receive their yearly flu vaccination may "catch" the flu from close contact.  We have anti-viral medications to treat the viruses that cause this infection. They are not a "cure" and only shorten the course of the infection. These prescriptions are most effective when they are given within the first 2 days of "flu" symptoms. Antiviral medication are indicated if you have a high risk of complications from the flu. You should  also consider an antiviral medication if you are in close contact with someone who is at risk. These medications can help patients avoid complications from the flu  but have side effects that you should know. Possible side effects from Tamiflu or oseltamivir include nausea, vomiting, diarrhea, dizziness, headaches, eye redness, sleep problems or other respiratory symptoms. You should not take Tamiflu if you have an allergy to oseltamivir or any to the ingredients in Tamiflu.  Based upon your symptoms and potential risk factors I have prescribed Oseltamivir (Tamiflu).  It has been sent to your designated pharmacy.  You will take one 75 mg capsule orally twice a day for the next 5 days.  ANYONE WHO HAS FLU SYMPTOMS SHOULD: Stay home. The flu is highly contagious and going out or to work exposes others! Be sure to drink plenty of fluids. Water is fine as well as fruit juices, sodas and electrolyte beverages. You may want to stay away from caffeine or alcohol. If you are nauseated, try taking small sips of liquids. How do you know if you are getting enough fluid? Your urine should be a pale yellow or almost colorless. Get rest. Taking a steamy  shower or using a humidifier may help nasal congestion and ease sore throat pain. Using a saline nasal spray works much the same way. Cough drops, hard candies and sore throat lozenges may ease your cough. Line up a caregiver. Have someone check on you regularly.   GET HELP RIGHT AWAY IF: You cannot keep down liquids or your medications. You become short of breath Your fell like you are going to pass out or loose consciousness. Your symptoms persist after you have completed your treatment plan MAKE SURE YOU  Understand these instructions. Will watch your condition. Will get help right away if you are not doing well or get worse.  Your e-visit answers were reviewed by a board certified advanced clinical practitioner to complete your personal care plan.  Depending on the condition, your plan could have included both over the counter or prescription medications.  If there is a problem please reply  once you have received a response from your provider.  Your safety is important to us.  If you have drug allergies check your prescription carefully.    You can use MyChart to ask questions about today's visit, request a non-urgent call back, or ask for a work or school excuse for 24 hours related to this e-Visit. If it has been greater than 24 hours you will need to follow up with your provider, or enter a new e-Visit to address those concerns.  You will get an e-mail in the next   two days asking about your experience.  I hope that your e-visit has been valuable and will speed your recovery. Thank you for using e-visits.   Approximately 5 minutes was used in reviewing the patient's chart, questionnaire, prescribing medications, and documentation.  

## 2021-07-09 ENCOUNTER — Telehealth: Payer: No Typology Code available for payment source | Admitting: Family

## 2021-07-09 ENCOUNTER — Other Ambulatory Visit (HOSPITAL_COMMUNITY): Payer: Self-pay

## 2021-07-09 DIAGNOSIS — M545 Low back pain, unspecified: Secondary | ICD-10-CM | POA: Diagnosis not present

## 2021-07-09 MED ORDER — NAPROXEN 500 MG PO TABS
500.0000 mg | ORAL_TABLET | Freq: Two times a day (BID) | ORAL | 0 refills | Status: DC
  Filled 2021-07-09: qty 30, 15d supply, fill #0

## 2021-07-09 MED ORDER — PREDNISONE 10 MG (21) PO TBPK
ORAL_TABLET | ORAL | 0 refills | Status: DC
  Filled 2021-07-09: qty 21, 6d supply, fill #0

## 2021-07-09 MED ORDER — BACLOFEN 10 MG PO TABS
10.0000 mg | ORAL_TABLET | Freq: Three times a day (TID) | ORAL | 0 refills | Status: DC
  Filled 2021-07-09: qty 30, 10d supply, fill #0

## 2021-07-09 NOTE — Progress Notes (Signed)

## 2021-07-12 ENCOUNTER — Ambulatory Visit (INDEPENDENT_AMBULATORY_CARE_PROVIDER_SITE_OTHER): Payer: No Typology Code available for payment source

## 2021-07-12 ENCOUNTER — Ambulatory Visit: Payer: No Typology Code available for payment source

## 2021-07-12 ENCOUNTER — Encounter: Payer: Self-pay | Admitting: Emergency Medicine

## 2021-07-12 ENCOUNTER — Ambulatory Visit
Admission: EM | Admit: 2021-07-12 | Discharge: 2021-07-12 | Disposition: A | Discharge status: Home / Self Care | Payer: No Typology Code available for payment source | Attending: Student | Admitting: Student

## 2021-07-12 DIAGNOSIS — S6992XA Unspecified injury of left wrist, hand and finger(s), initial encounter: Secondary | ICD-10-CM

## 2021-07-12 DIAGNOSIS — M79645 Pain in left finger(s): Secondary | ICD-10-CM | POA: Diagnosis not present

## 2021-07-12 NOTE — Discharge Instructions (Addendum)
-  Your xray looks okay. No broken bones.  ?-Most of the time a jammed finger will get better on its own within a week or two. Ice, rest, tylenol/ibuprofen. ? ?

## 2021-07-12 NOTE — ED Triage Notes (Signed)
Pt reports was playing basketball last night with son and reports "jammed" left thumb pain and swelling ever since.pt has tried ice and otc medication with minimal relief of symptoms.  ?

## 2021-07-12 NOTE — ED Provider Notes (Addendum)
?[facility] ? ? ? ?CSN: [id number] ?Arrival date & time: 07/12/21  1408 ? ? ?  ? ?History   ?Chief Complaint ?Chief Complaint  ?Patient presents with  ? Extremity Pain  ?  Possible broken thumb on left hand. It may be jammed or sprained. Just want to make sure. - Entered by patient  ? ? ?HPI ?Jacob Barber is a 36 y.o. male presenting with left thumb jam.  History noncontributory, no prior injury to the left thumb.  He is right-handed.  States that the ball hit the tip of his thumb, pressing it inward.  Now with significant pain and mild swelling over the thenar pad.  Denies sensation changes. ? ?HPI ? ?Past Medical History:  ?Diagnosis Date  ? Childhood asthma   ? GERD (gastroesophageal reflux disease)   ? ? ?Patient Active Problem List  ? Diagnosis Date Noted  ? Other chest pain 04/29/2016  ? SOB (shortness of breath) 01/31/2016  ? Asthma in adult 01/31/2016  ? Chest pain 01/31/2016  ? Fatigue 01/31/2016  ? GERD (gastroesophageal reflux disease) 01/30/2016  ? ? ?Past Surgical History:  ?Procedure Laterality Date  ? ESOPHAGOGASTRODUODENOSCOPY N/A 05/08/2016  ? Procedure: ESOPHAGOGASTRODUODENOSCOPY (EGD);  Surgeon: Malissa Hippo, MD;  Location: [facility];  Service: Endoscopy;  Laterality: N/A;  11:55  ? ? ? ? ? ?Home Medications   ? ?Prior to Admission medications   ?Medication Sig Start Date End Date Taking? Authorizing Provider  ?albuterol (VENTOLIN HFA) 108 (90 Base) MCG/ACT inhaler Inhale 2 puffs into the lungs every 4 (four) hours as needed for wheezing or shortness of breath. 03/03/19   Avegno, Zachery Dakins, FNP  ?baclofen (LIORESAL) 10 MG tablet Take 1 tablet (10 mg total) by mouth 3 (three) times daily. 07/09/21   Junie Spencer, FNP  ?fluticasone (FLONASE) 50 MCG/ACT nasal spray Place 2 sprays into both nostrils daily. 05/21/18   Wurst, [place], PA-C  ?hydrOXYzine (ATARAX/VISTARIL) 10 MG tablet Take 1-2 tablets (10-20 mg total) by mouth every 8 (eight) hours as needed for anxiety. ?Patient  not taking: Reported on 07/14/2019 06/02/19   Petrucelli, Lelon Mast R, PA-C  ?metoprolol tartrate (LOPRESSOR) 100 MG tablet Take 1 tablet (100 mg total) by mouth once for 1 dose. 2 hours before your cardiac cta 07/14/19 07/14/19  Jonelle Sidle, MD  ?naproxen (NAPROSYN) 500 MG tablet Take 1 tablet (500 mg total) by mouth 2 (two) times daily with a meal. 07/09/21   Junie Spencer, FNP  ?predniSONE (STERAPRED UNI-PAK 21 TAB) 10 MG (21) TBPK tablet Use as directed on package instructions 07/09/21   Junie Spencer, FNP  ?traZODone (DESYREL) 50 MG tablet Take 1 tablet by mouth at bedtime as needed for sleep. 03/23/19   [provider]  ? ? ?Family History ?Family History  ?Problem Relation Age of Onset  ? CAD Father   ?     Diagnosed in his 46s  ? Sleep apnea Father   ? Healthy Sister   ? ? ?Social History ?Social History  ? ?Tobacco Use  ? Smoking status: Former  ?  Types: Cigarettes  ?  Start date: 01/31/2012  ?  Quit date: 02/23/2019  ?  Years since quitting: 2.3  ? Smokeless tobacco: Never  ? Tobacco comments:  ?  just on weekends  ?Vaping Use  ? Vaping Use: Never used  ?Substance Use Topics  ? Alcohol use: Yes  ?  Comment: Occasional  ? Drug use: No  ? ? ? ?  Allergies   ?Erythromycin and Sulfa antibiotics ? ? ?Review of Systems ?Review of Systems  ?Musculoskeletal:   ?     L thumb pain   ?All other systems reviewed and are negative. ? ? ?Physical Exam ?Triage Vital Signs ?ED Triage Vitals  ?Enc Vitals Group  ?   BP 07/12/21 1518 121/77  ?   Pulse Rate 07/12/21 1518 60  ?   Resp 07/12/21 1518 18  ?   Temp 07/12/21 1518 97.6 ?F (36.4 ?C)  ?   Temp Source 07/12/21 1518 Oral  ?   SpO2 07/12/21 1518 96 %  ?   Weight 07/12/21 1519 210 lb (95.3 kg)  ?   Height 07/12/21 1519 6\' 1"  (1.854 m)  ?   Head Circumference --   ?   Peak Flow --   ?   Pain Score 07/12/21 1518 3  ?   Pain Loc --   ?   Pain Edu? --   ?   Excl. in GC? --   ? ?No data found. ? ?Updated Vital Signs ?BP 121/77 (BP Location: Right Arm)   Pulse 60    Temp 97.6 ?F (36.4 ?C) (Oral)   Resp 18   Ht 6\' 1"  (1.854 m)   Wt 210 lb (95.3 kg)   SpO2 96%   BMI 27.71 kg/m?  ? ?Visual Acuity ?Right Eye Distance:   ?Left Eye Distance:   ?Bilateral Distance:   ? ?Right Eye Near:   ?Left Eye Near:    ?Bilateral Near:    ? ?Physical Exam ?Vitals reviewed.  ?Constitutional:   ?   General: He is not in acute distress. ?   Appearance: Normal appearance. He is not ill-appearing.  ?HENT:  ?   Head: Normocephalic and atraumatic.  ?Pulmonary:  ?   Effort: Pulmonary effort is normal.  ?Musculoskeletal:  ?   Comments: L thumb - TTP MCP joint, with mild swelling and tenderness over thenar pad. ROM thumb abduction and adduction intact but with pain and stiffness. No IP joint tenderness. No snuffbox tenderness. Radial pulse 2+, cap refill <2 seconds.   ?Neurological:  ?   General: No focal deficit present.  ?   Mental Status: He is alert and oriented to person, place, and time.  ?Psychiatric:     ?   Mood and Affect: Mood normal.     ?   Behavior: Behavior normal.     ?   Thought Content: Thought content normal.     ?   Judgment: Judgment normal.  ? ? ? ?UC Treatments / Results  ?Labs ?(all labs ordered are listed, but only abnormal results are displayed) ?Labs Reviewed - No data to display ? ?EKG ? ? ?Radiology ?DG Hand Complete Left ? ?Result Date: 07/12/2021 ?CLINICAL DATA:  Left thumb injury today EXAM: LEFT HAND - COMPLETE 3+ VIEW COMPARISON:  None. FINDINGS: Frontal, oblique, and lateral views of the left hand are obtained. No acute fracture, subluxation, or dislocation. Joint spaces are well preserved. Soft tissues are unremarkable. IMPRESSION: 1. Unremarkable left hand. Electronically Signed   By: Sharlet SalinaMichael  Brown M.D.   On: 07/12/2021 15:19   ? ?Procedures ?Procedures (including critical care time) ? ?Medications Ordered in UC ?Medications - No data to display ? ?Initial Impression / Assessment and Plan / UC Course  ?I have reviewed the triage vital signs and the nursing  notes. ? ?Pertinent labs & imaging results that were available during my care of the patient were reviewed  by me and considered in my medical decision making (see chart for details). ? ?  ? ?This patient is a very pleasant 36 y.o. year old male presenting with L thumb jam. Neurovascularly intact.  ? ?Xray L thumb - negative.  ? ?Place in wrist brace with abd thumb. RICE. ? ?ED return precautions discussed. Patient verbalizes understanding and agreement.  ? ? ?Final Clinical Impressions(s) / UC Diagnoses  ? ?Final diagnoses:  ?Injury of left thumb, initial encounter  ? ? ? ?Discharge Instructions   ? ?  ?-Your xray looks okay. No broken bones.  ?-Most of the time a jammed finger will get better on its own within a week or two. Ice, rest, tylenol/ibuprofen. ? ? ? ?ED Prescriptions   ?None ?  ? ?PDMP not reviewed this encounter. ?  ?Rhys Martini, PA-C ?07/12/21 1545 ? ?  ?Rhys Martini, PA-C ?07/12/21 1546 ? ?

## 2021-08-26 ENCOUNTER — Telehealth: Payer: No Typology Code available for payment source | Admitting: Family Medicine

## 2021-08-26 DIAGNOSIS — J069 Acute upper respiratory infection, unspecified: Secondary | ICD-10-CM

## 2021-08-26 NOTE — Progress Notes (Signed)

## 2021-12-10 IMAGING — DX DG CHEST 2V
2 series · 2 of 2 positions shown · non-contrast
Comparison: March 03, 2019

CLINICAL DATA: Chest Pain today.

EXAM:
CHEST - 2 VIEW

[chest pa]
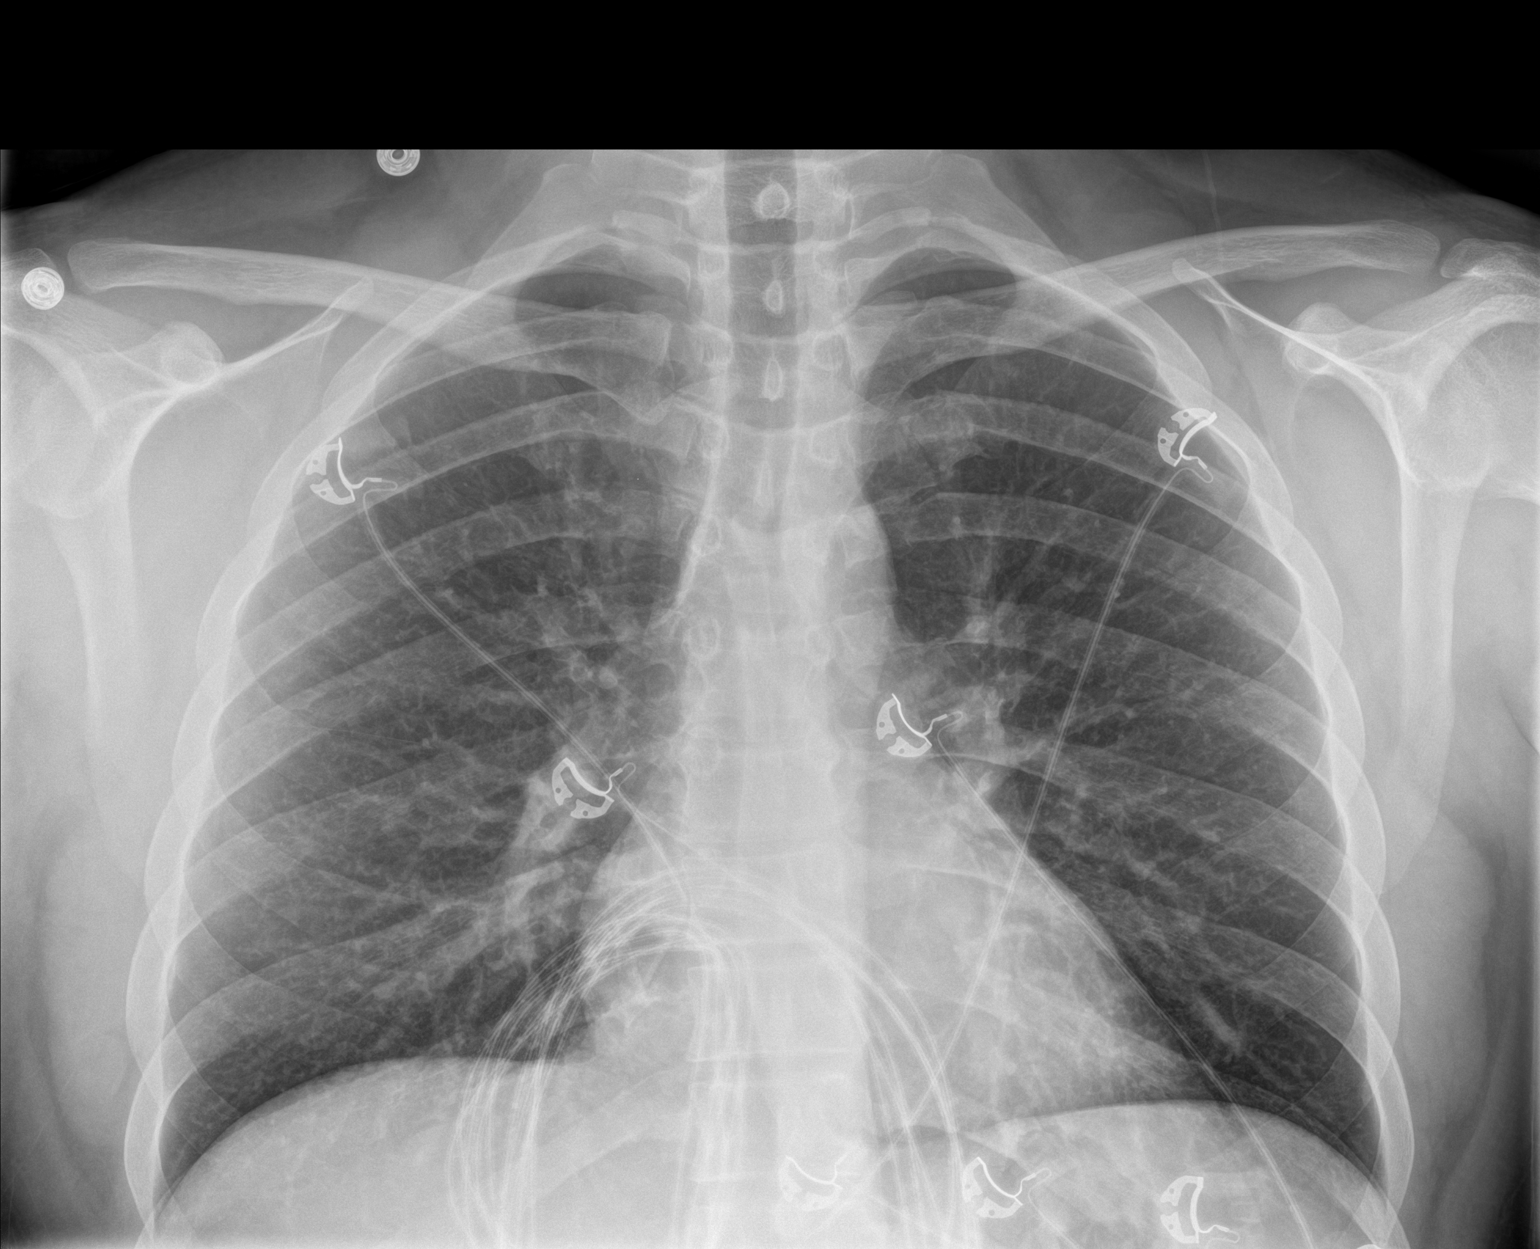

[chest lat]
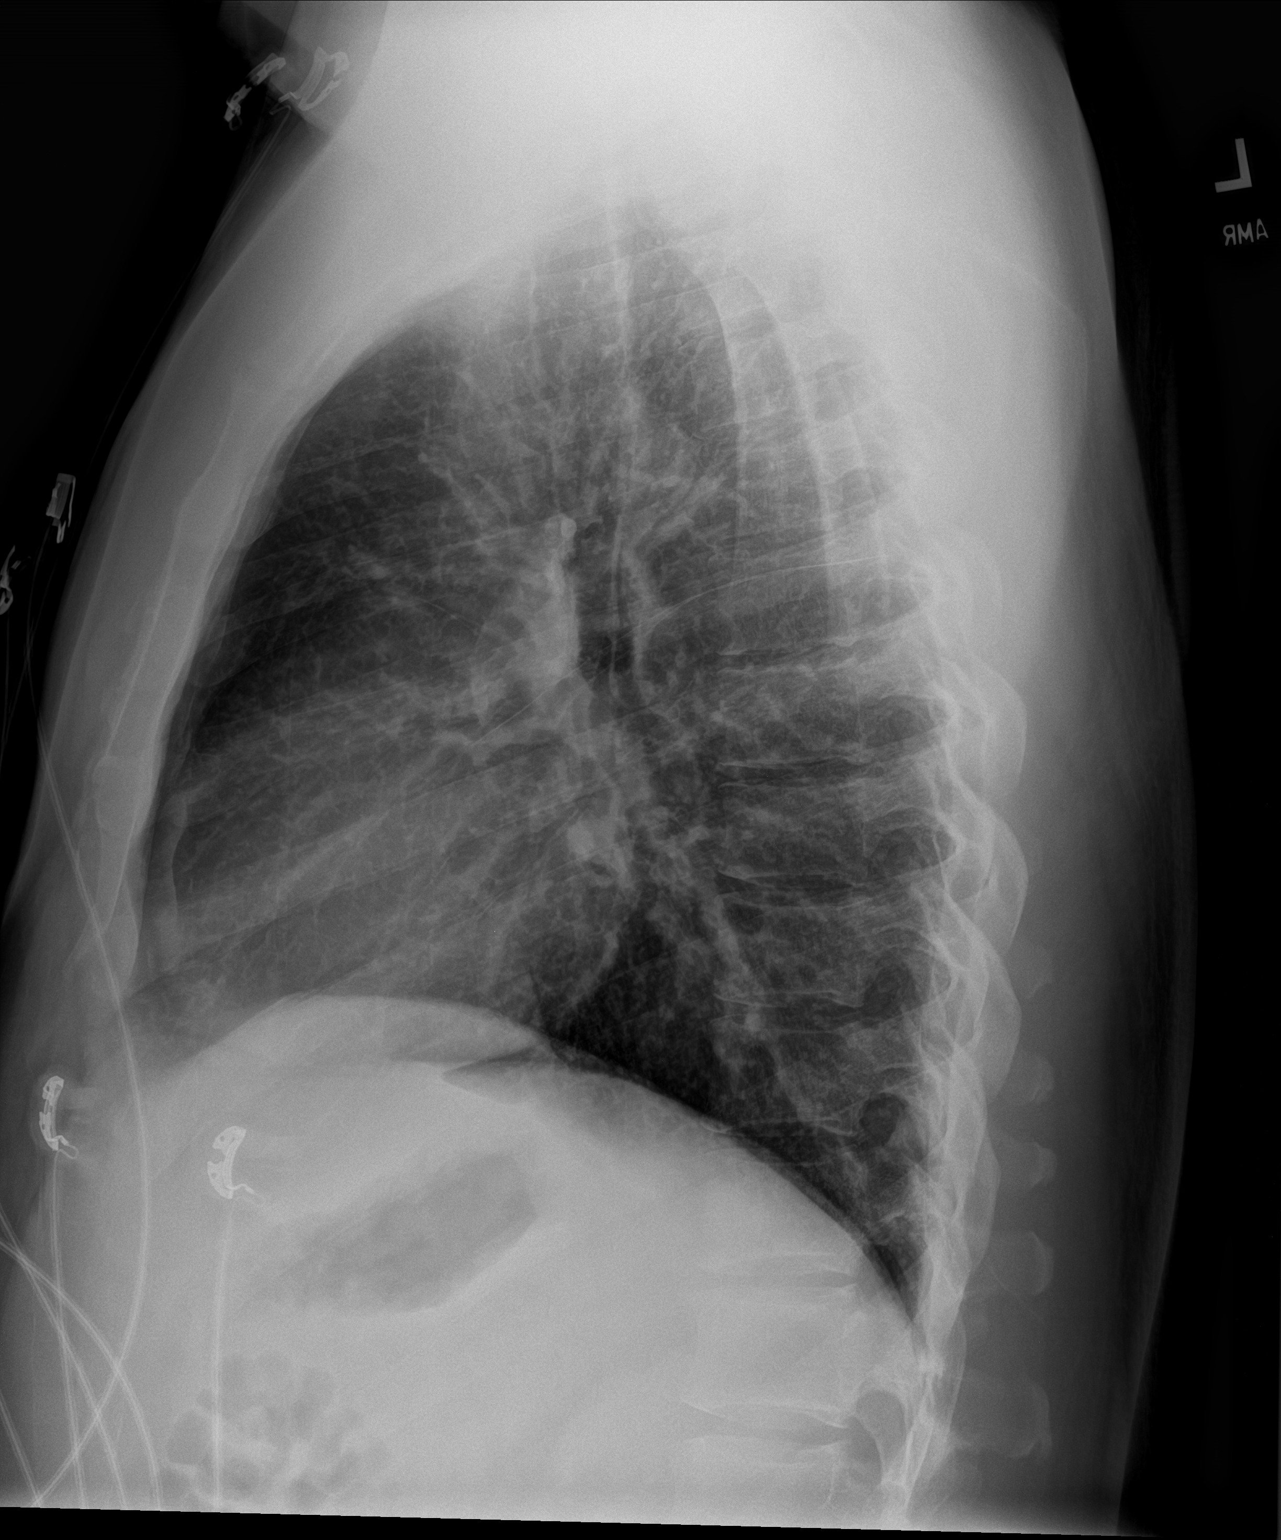

[2 of 2 positions shown; findings below may reference images not displayed]

FINDINGS: The heart size and mediastinal contours are within normal limits.
Both lungs are clear. The visualized skeletal structures are
unremarkable.
IMPRESSION: No active cardiopulmonary disease.

## 2022-02-12 ENCOUNTER — Encounter (HOSPITAL_COMMUNITY): Payer: Self-pay | Admitting: Emergency Medicine

## 2022-02-12 ENCOUNTER — Emergency Department (HOSPITAL_COMMUNITY): Payer: No Typology Code available for payment source

## 2022-02-12 ENCOUNTER — Other Ambulatory Visit: Payer: Self-pay

## 2022-02-12 ENCOUNTER — Emergency Department (HOSPITAL_COMMUNITY)
Admission: EM | Admit: 2022-02-12 | Discharge: 2022-02-12 | Disposition: A | Discharge status: Home / Self Care | Payer: No Typology Code available for payment source | Attending: Student | Admitting: Student

## 2022-02-12 DIAGNOSIS — F410 Panic disorder [episodic paroxysmal anxiety] without agoraphobia: Secondary | ICD-10-CM | POA: Diagnosis not present

## 2022-02-12 DIAGNOSIS — R079 Chest pain, unspecified: Secondary | ICD-10-CM | POA: Diagnosis present

## 2022-02-12 DIAGNOSIS — Z7951 Long term (current) use of inhaled steroids: Secondary | ICD-10-CM | POA: Insufficient documentation

## 2022-02-12 DIAGNOSIS — D72829 Elevated white blood cell count, unspecified: Secondary | ICD-10-CM | POA: Insufficient documentation

## 2022-02-12 DIAGNOSIS — R000 Tachycardia, unspecified: Secondary | ICD-10-CM | POA: Insufficient documentation

## 2022-02-12 DIAGNOSIS — E876 Hypokalemia: Secondary | ICD-10-CM | POA: Diagnosis not present

## 2022-02-12 DIAGNOSIS — J45909 Unspecified asthma, uncomplicated: Secondary | ICD-10-CM | POA: Diagnosis not present

## 2022-02-12 DIAGNOSIS — Z87891 Personal history of nicotine dependence: Secondary | ICD-10-CM | POA: Diagnosis not present

## 2022-02-12 LAB — CBC
HCT: 44.4 % (ref 39.0–52.0)
Hemoglobin: 15.8 g/dL (ref 13.0–17.0)
MCH: 30.5 pg (ref 26.0–34.0)
MCHC: 35.6 g/dL (ref 30.0–36.0)
MCV: 85.7 fL (ref 80.0–100.0)
Platelets: 249 10*3/uL (ref 150–400)
RBC: 5.18 MIL/uL (ref 4.22–5.81)
RDW: 12.3 % (ref 11.5–15.5)
WBC: 10.6 10*3/uL — ABNORMAL HIGH (ref 4.0–10.5)
nRBC: 0 % (ref 0.0–0.2)

## 2022-02-12 LAB — BASIC METABOLIC PANEL
Anion gap: 9 (ref 5–15)
BUN: 13 mg/dL (ref 6–20)
CO2: 23 mmol/L (ref 22–32)
Calcium: 9.5 mg/dL (ref 8.9–10.3)
Chloride: 103 mmol/L (ref 98–111)
Creatinine, Ser: 1.18 mg/dL (ref 0.61–1.24)
GFR, Estimated: >60 mL/min (ref >60)
Glucose, Bld: 108 mg/dL — ABNORMAL HIGH (ref 70–99)
Potassium: 3.3 mmol/L — ABNORMAL LOW (ref 3.5–5.1)
Sodium: 135 mmol/L (ref 135–145)

## 2022-02-12 LAB — RAPID URINE DRUG SCREEN, HOSP PERFORMED
Amphetamines: NOT DETECTED
Barbiturates: NOT DETECTED
Benzodiazepines: NOT DETECTED
Cocaine: NOT DETECTED
Opiates: NOT DETECTED
Tetrahydrocannabinol: NOT DETECTED

## 2022-02-12 LAB — TROPONIN I (HIGH SENSITIVITY)
Troponin I (High Sensitivity): 5 ng/L (ref <18)
Troponin I (High Sensitivity): 4 ng/L (ref <18)

## 2022-02-12 MED ORDER — LORAZEPAM 1 MG PO TABS
1.0000 mg | ORAL_TABLET | Freq: Once | ORAL | Status: AC
  Administered 2022-02-12: 1 mg via ORAL
  Filled 2022-02-12: qty 1

## 2022-02-12 MED ORDER — LORAZEPAM 1 MG PO TABS: 1.0000 mg | ORAL_TABLET | Freq: Three times a day (TID) | ORAL | 0 refills | Status: DC | PRN

## 2022-02-12 NOTE — ED Triage Notes (Signed)
Patient c/o chest pain and shob that started today.  Patient reports he has intermittent chest pain and has had an echo and stress test with no findings.  Patient reports this feels like a panic attack but he has no history of anxiety.

## 2022-02-13 NOTE — ED Provider Notes (Signed)
[facility] EMERGENCY DEPARTMENT Provider Note  CSN: [id number] Arrival date & time: 02/12/22 1741  Chief Complaint(s) Chest Pain and Shortness of Breath  HPI Jacob Barber is a 36 y.o. male with PMH GERD, childhood asthma who presents emergency department for evaluation of chest pain, shortness of breath.  Patient states that his pain began suddenly with no associated triggers and arrives tachypneic, tachycardic and very uncomfortable.  Denies associated diaphoresis, nausea, vomiting.  Denies numbness, tingling, weakness or other neurologic complaints.  Patient states that these events occur approximately once a month and he has had a full cardiac work-up and an EGD with no official diagnosis.  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  He has no known history of anxiety and was prescribed bupropion as needed which she states he took 2 prior to arrival   Past Medical History Past Medical History:  Diagnosis Date   Childhood asthma    GERD (gastroesophageal reflux disease)    Patient Active Problem List   Diagnosis Date Noted   Other chest pain 04/29/2016   SOB (shortness of breath) 01/31/2016   Asthma in adult 01/31/2016   Chest pain 01/31/2016   Fatigue 01/31/2016   GERD (gastroesophageal reflux disease) 01/30/2016   Home Medication(s) Prior to Admission medications   Medication Sig Start Date End Date Taking? Authorizing Provider  acetaminophen (TYLENOL) 325 MG tablet Take 650 mg by mouth every 6 (six) hours as needed.   Yes [provider]  busPIRone (BUSPAR) 5 MG tablet Take 5 mg by mouth 2 (two) times daily. 10/15/21  Yes [provider]  fluticasone (FLONASE) 50 MCG/ACT nasal spray Place 2 sprays into both nostrils daily. 05/21/18  Yes Wurst, [place], PA-C  LORazepam (ATIVAN) 1 MG tablet Take 1 tablet (1 mg total) by mouth 3 (three) times daily as  needed (panic attack). 02/12/22  Yes Shontavia Mickel, MD  traZODone (DESYREL) 50 MG tablet Take 1 tablet by mouth at bedtime as needed for sleep. 03/23/19  Yes [provider]                                                                                                                                    Past Surgical History Past Surgical History:  Procedure Laterality Date   ESOPHAGOGASTRODUODENOSCOPY N/A 05/08/2016   Procedure: ESOPHAGOGASTRODUODENOSCOPY (EGD);  Surgeon: Rogene Houston, MD;  Location: [facility];  Service: Endoscopy;  Laterality: N/A;  11:55   Family History Family History  Problem Relation Age of Onset   CAD Father        Diagnosed in his 69s   Sleep apnea Father    Healthy Sister     Social History Social History   Tobacco Use   Smoking status: Former    Types: Cigarettes    Start date:  01/31/2012    Quit date: 02/23/2019    Years since quitting: 2.9   Smokeless tobacco: Never   Tobacco comments:    just on weekends  Vaping Use   Vaping Use: Never used  Substance Use Topics   Alcohol use: Yes    Comment: Occasional   Drug use: No   Allergies Erythromycin and Sulfa antibiotics  Review of Systems Review of Systems  Respiratory:  Positive for chest tightness and shortness of breath.   Cardiovascular:  Positive for chest pain.    Physical Exam Vital Signs  I have reviewed the triage vital signs BP (!) 141/91   Pulse 81   Temp 98.3 F (36.8 C) (Oral)   Resp 20   Ht 6\' 1"  (1.854 m)   Wt 97.1 kg   SpO2 99%   BMI 28.23 kg/m    Physical Exam Constitutional:      General: He is in acute distress.     Appearance: Normal appearance.  HENT:     Head: Normocephalic and atraumatic.     Nose: No congestion or rhinorrhea.  Eyes:     General:        Right eye: No discharge.        Left eye: No discharge.     Extraocular Movements: Extraocular movements intact.     Pupils: Pupils are equal, round, and reactive to light.   Cardiovascular:     Rate and Rhythm: Regular rhythm. Tachycardia present.     Heart sounds: No murmur heard. Pulmonary:     Effort: Tachypnea present. No respiratory distress.     Breath sounds: No wheezing or rales.  Abdominal:     General: There is no distension.     Tenderness: There is no abdominal tenderness.  Musculoskeletal:        General: Normal range of motion.     Cervical back: Normal range of motion.  Skin:    General: Skin is warm and dry.  Neurological:     General: No focal deficit present.     Mental Status: He is alert.     ED Results and Treatments Labs (all labs ordered are listed, but only abnormal results are displayed) Labs Reviewed  BASIC METABOLIC PANEL - Abnormal; Notable for the following components:      Result Value   Potassium 3.3 (*)    Glucose, Bld 108 (*)    All other components within normal limits  CBC - Abnormal; Notable for the following components:   WBC 10.6 (*)    All other components within normal limits  RAPID URINE DRUG SCREEN, HOSP PERFORMED  TROPONIN I (HIGH SENSITIVITY)  TROPONIN I (HIGH SENSITIVITY)                                                                                                                          Radiology DG Chest 2 View  Result Date: 02/12/2022 CLINICAL DATA:  Chest pain and shortness of breath. EXAM:  CHEST - 2 VIEW COMPARISON:  Chest radiograph dated 06/02/2019 FINDINGS: The heart size and mediastinal contours are within normal limits. Both lungs are clear. The visualized skeletal structures are unremarkable. IMPRESSION: No active cardiopulmonary disease. Electronically Signed   By: Zerita Boers M.D.   On: 02/12/2022 18:56    Pertinent labs & imaging results that were available during my care of the patient were reviewed by me and considered in my medical decision making (see MDM for details).  Medications Ordered in ED Medications  LORazepam (ATIVAN) tablet 1 mg (1 mg Oral Given 02/12/22 1848)                                                                                                                                      Procedures Procedures  (including critical care time)  Medical Decision Making / ED Course   This patient presents to the ED for concern of chest pain, shortness of breath, this involves an extensive number of treatment options, and is a complaint that carries with it a high risk of complications and morbidity.  The differential diagnosis includes ACS, PE,  panic attack, pneumonia, esophageal spasm, coronary vasospasm  MDM: Patient seen in the emergency department for evaluation of chest pain and shortness of breath.  Physical exam reveals a very anxious appearing tachypneic and tachycardic patient but cardiopulmonary exam is otherwise unremarkable.  Laboratory evaluation with some mild hypokalemia to 3.3, leukocytosis to 10.6 but is otherwise unremarkable.  Troponin and delta troponin unremarkable.  UDS is negative and obtained to ensure no sympathomimetic involvement of his symptoms.  Chest x-ray unremarkable.  ECG nonischemic.  Patient's heart score is 1 I have overall low suspicion for ACS at this time.  Given the recurrent unprovoked nature of the symptoms, with dramatic presentation and sensation of panic, I did have concern for underlying panic disorder and we performed an Ativan trial that led to almost complete resolution of the patient's symptoms.  His vital signs normalized and he states he feels significantly improved.  I have overall low suspicion for pulmonary embolism given complete resolution of symptoms with Ativan and he is low risk by Wells criteria.  At this point, I have concern that the patient has undiagnosed panic disorder and he would significantly benefit from appropriate psychiatric evaluation in the outpatient setting.  He was given resources to establish care with a behavioral health urgent care and I will provide a short term abortive  prescription for Ativan until he can establish care with a psychiatrist.  He was given strict return precautions of which he voiced understanding he was discharged.   Additional history obtained: -Additional history obtained from multiple family members -External records from outside source obtained and reviewed including: Chart review including previous notes, labs, imaging, consultation notes   Lab Tests: -I ordered, reviewed, and interpreted labs.   The pertinent results include:   Labs Reviewed  BASIC METABOLIC  PANEL - Abnormal; Notable for the following components:      Result Value   Potassium 3.3 (*)    Glucose, Bld 108 (*)    All other components within normal limits  CBC - Abnormal; Notable for the following components:   WBC 10.6 (*)    All other components within normal limits  RAPID URINE DRUG SCREEN, HOSP PERFORMED  TROPONIN I (HIGH SENSITIVITY)  TROPONIN I (HIGH SENSITIVITY)      EKG   EKG Interpretation  Date/Time:  Thursday February 12 2022 17:51:23 EDT Ventricular Rate:  94 PR Interval:  136 QRS Duration: 88 QT Interval:  350 QTC Calculation: 437 R Axis:   88 Text Interpretation: Normal sinus rhythm with sinus arrhythmia Normal ECG When compared with ECG of 02-Jun-2019 14:51, PREVIOUS ECG IS PRESENT Confirmed by Samak ([id number]) on 02/12/2022 6:21:52 PM         Imaging Studies ordered: I ordered imaging studies including chest x-ray I independently visualized and interpreted imaging. I agree with the radiologist interpretation   Medicines ordered and prescription drug management: Meds ordered this encounter  Medications   LORazepam (ATIVAN) tablet 1 mg   LORazepam (ATIVAN) 1 MG tablet    Sig: Take 1 tablet (1 mg total) by mouth 3 (three) times daily as needed (panic attack).    Dispense:  15 tablet    Refill:  0    -I have reviewed the patients home medicines and have made adjustments as needed  Critical interventions none  Cardiac  Monitoring: The patient was maintained on a cardiac monitor.  I personally viewed and interpreted the cardiac monitored which showed an underlying rhythm of: NSR, sinus tachycardia  Social Determinants of Health:  Factors impacting patients care include: none   Reevaluation: After the interventions noted above, I reevaluated the patient and found that they have :improved  Co morbidities that complicate the patient evaluation  Past Medical History:  Diagnosis Date   Childhood asthma    GERD (gastroesophageal reflux disease)       Dispostion: I considered admission for this patient, but with a heart score of 1, low risk by Wells criteria and symptoms improved with Ativan trial, patient safe for discharge with outpatient psychiatric follow-up and return precautions     Final Clinical Impression(s) / ED Diagnoses Final diagnoses:  Panic attack  Chest pain, unspecified type     @PCDICTATION @    Teressa Lower, MD 02/13/22 1240

## 2022-02-26 ENCOUNTER — Ambulatory Visit (HOSPITAL_COMMUNITY)
Admission: EM | Admit: 2022-02-26 | Discharge: 2022-02-26 | Disposition: A | Discharge status: Home / Self Care | Payer: No Typology Code available for payment source

## 2022-02-26 DIAGNOSIS — F419 Anxiety disorder, unspecified: Secondary | ICD-10-CM

## 2022-02-26 NOTE — BH Assessment (Signed)
Comprehensive Clinical Assessment (CCA) Screening, Triage and Referral Note  02/26/2022 Jacob Barber [id number]  Chief Complaint: No chief complaint on file.  Visit Diagnosis:   Patient Reported Information How did you hear about [place]? -- (Referred by the Emergency Department.)  What Is the Reason for Your Visit/Call Today?  Screening/Triage completed. Patient is Routine. Please room patient.     Jacob Barber is a 36 yr old male that presents to the [facility] with a complaint of panic attacks. States that symptoms started in 2015. He initially thought it was medical related (heart burn). After medical clearance ("Stress test, Echo, Troponin test, etc.") over the years he was told that he was having anxiety. He describes his anxiety symptoms as "Chest pain, arm numbness, restless, etc.". The panic attacks tend to last up to 45 minutes. He recently had a panic attack 02/12/2022 leading to an Emergency Room visit to [facility]. He was medically cleared and given Ativan during that visit and a prescription. He describes another panic attack occurring at work recently, took an Ativan, and it seemed to help relieve his symptoms. The most recent panic attack was 1 hour ago, prior to his arrival,  he took an Ativan, and it helped minimize his symptoms. States that his anxiety "comes I'm waves". Denies suicidal ideations, homicidal ideations, AVH's.  No substance use. No hx of outpatient psychiatric treatment. PCP has prescribed patient Buspirone as well.  How Long Has This Been Causing You Problems? > than 6 months  What Do You Feel Would Help You the Most Today? Medication(s)   Have You Recently Had Any Thoughts About Hurting Yourself? No  Are You Planning to Commit Suicide/Harm Yourself At This time? No   Have you Recently Had Thoughts About Hurting Someone Karolee Ohs? No  Are You Planning to Harm Someone at This Time? No  Explanation: No data recorded  Have You Used Any Alcohol or Drugs in the Past 24  Hours? No  How Long Ago Did You Use Drugs or Alcohol? No data recorded What Did You Use and How Much? No data recorded  Do You Currently Have a Therapist/Psychiatrist? No data recorded Name of Therapist/Psychiatrist: No data recorded  Have You Been Recently Discharged From Any Office Practice or Programs? No data recorded Explanation of Discharge From Practice/Program: No data recorded   CCA Screening Triage Referral Assessment Type of Contact: No data recorded Telemedicine Service Delivery:   Is this Initial or Reassessment?   Date Telepsych consult ordered in [facility]:    Time Telepsych consult ordered in [facility]:    Location of Assessment: No data recorded Provider Location: No data recorded   Collateral Involvement: No data recorded  Does Patient Have a Court Appointed Legal Guardian? No data recorded Name and Contact of Legal Guardian: No data recorded If Minor and Not Living with Parent(s), Who has Custody? No data recorded Is CPS involved or ever been involved? No data recorded Is APS involved or ever been involved? No data recorded  Patient Determined To Be At Risk for Harm To Self or Others Based on Review of Patient Reported Information or Presenting Complaint? No data recorded Method: No data recorded Availability of Means: No data recorded Intent: No data recorded Notification Required: No data recorded Additional Information for Danger to Others Potential: No data recorded Additional Comments for Danger to Others Potential: No data recorded Are There Guns or Other Weapons in Your Home? No data recorded Types of Guns/Weapons: No data recorded Are These Weapons Safely Secured?  No data recorded Who Could Verify You Are Able To Have These Secured: No data recorded Do You Have any Outstanding Charges, Pending Court Dates, Parole/Probation? No data recorded Contacted To Inform of Risk of Harm To Self or Others: No data recorded  Does Patient Present  under Involuntary Commitment? No data recorded   [place] of Residence: No data recorded  Patient Currently Receiving the Following Services: No data recorded  Determination of Need: Routine (7 days)   Options For Referral: Medication Management; Outpatient Therapy   Discharge Disposition:     Melynda Ripple, Counselor

## 2022-02-26 NOTE — ED Provider Notes (Signed)
Behavioral Health Urgent Care Medical Screening Exam  Patient Name: Jacob Barber MRN: [id number] Date of Evaluation: 02/26/22 Chief Complaint:   Diagnosis:  Final diagnoses:  Anxiety disorder, unspecified type   History of Present illness: Jacob Barber is a 36 y.o. male. Pt presents voluntarily to [facility] behavioral health for walk-in assessment.  Pt is assessed face-to-face by nurse practitioner.   Jacob Barber, 36 y.o., male patient seen face to face by this provider, and chart reviewed on 02/26/22.  On evaluation Jacob Barber reports he is presenting today due to "panic attacks since 2017". Pt reports worsening severity and frequency of panic attacks. He reports panic attacks usually are sporadic, however, he has experienced 3 panic attacks since [facility] visit on October 26th. His last panic attack was earlier today. Pt does not identify any triggers to panic attacks. He reports during panic attacks, he will experience chest tightness, hyperventilation, numbness/tingling, feeling like he is going to faint. Pt reports he initially thought panic attacks were due to heart condition. He has had echocardiogram, stress test, troponins, and EGD done in the past which have come back clear.   Pt denies SI/VI/HI, AVH, paranoia.   Pt reports social alcohol use 2 drinks/occasion about twice a month. Pt denies use of nicotine, marijuana, crack/cocaine, other substance use.   Pt denies he is in counseling. Pt reports he is being prescribed buspar 5mg  BID by PCP. He received 15 ativan 1mg  tablets while at the ED on October 26th. He reports he has taken 3 ativan in total so far, including the one he received while at the ED. He reports ativan has been helpful during panic attacks.   Pt denies hx of NSSIB, inpatient psychiatric hospitalization, or SA.  Pt is living with his son.  Pt reports he is employed at as a [employer].   Pt denies knowledge of family psychiatric  history.  Discussed w/ pt recommendation for medication management and counseling. Pt agrees w/ recommendation. Pt discharged with resource.  Flowsheet Row ED from 02/12/2022 in [facility] EMERGENCY DEPARTMENT ED from 07/12/2021 in [facility] at [facility] RISK CATEGORY No Risk No Risk       Psychiatric Specialty Exam  Presentation  General Appearance:Appropriate for Environment; Casual; Fairly Groomed  Eye Contact:Good  Speech:Clear and Coherent; Normal Rate  Speech Volume:Normal  Handedness:No data recorded  Mood and Affect  Mood:Euthymic  Affect:Congruent; Full Range   Thought Process  Thought Processes:Coherent; Goal Directed; Linear  Descriptions of Associations:Intact  Orientation:Full (Time, Place and Person)  Thought Content:Logical    Hallucinations:None  Ideas of Reference:None  Suicidal Thoughts:No  Homicidal Thoughts:No   Sensorium  Memory:Immediate Good; Recent Good; Remote Good  Judgment:Good  Insight:Good   Executive Functions  Concentration:Good  Attention Span:Good  Recall:Good  Fund of Knowledge:Good  Language:Good   Psychomotor Activity  Psychomotor Activity:Normal   Assets  Assets:Communication Skills; Desire for Improvement; Financial Resources/Insurance; Housing; Intimacy; Leisure Time; Physical Health; Resilience; Social Support; Talents/Skills; Transportation; Vocational/Educational   Sleep  Sleep:Good  Number of hours: No data recorded  No data recorded  Physical Exam: Physical Exam Constitutional:      Appearance: Normal appearance.  HENT:     Head: Normocephalic and atraumatic.  Eyes:     General: Scleral icterus present.  Cardiovascular:     Rate and Rhythm: Normal rate.  Pulmonary:     Effort: Pulmonary effort is normal. No respiratory distress.  Neurological:  Mental Status: He is alert and oriented to person, place, and time.  Psychiatric:        Attention and  Perception: Attention and perception normal.        Mood and Affect: Mood and affect normal.        Speech: Speech normal.        Behavior: Behavior normal. Behavior is cooperative.        Thought Content: Thought content normal.        Cognition and Memory: Cognition and memory normal.        Judgment: Judgment normal.    Review of Systems  Constitutional:  Negative for chills and fever.  Respiratory:  Negative for shortness of breath.   Cardiovascular:  Negative for chest pain and palpitations.  Gastrointestinal:  Negative for abdominal pain.  Neurological:  Negative for dizziness and headaches.  Psychiatric/Behavioral: Negative.     Blood pressure 123/88, pulse 80, temperature 98.4 F (36.9 C), temperature source Oral, resp. rate 19, SpO2 97 %. There is no height or weight on file to calculate BMI.  Musculoskeletal: Strength & Muscle Tone: within normal limits Gait & Station: normal Patient leans: N/A   [facility] MSE Discharge Disposition for Follow up and Recommendations: Based on my evaluation the patient does not appear to have an emergency medical condition and can be discharged with resources and follow up care in outpatient services for Medication Management and Individual Therapy   Lauree Chandler, NP 02/26/2022, 4:19 PM

## 2022-02-26 NOTE — Discharge Instructions (Addendum)

## 2022-05-12 ENCOUNTER — Telehealth: Payer: 59 | Admitting: Physician Assistant

## 2022-05-12 DIAGNOSIS — R030 Elevated blood-pressure reading, without diagnosis of hypertension: Secondary | ICD-10-CM | POA: Diagnosis not present

## 2022-05-12 DIAGNOSIS — J029 Acute pharyngitis, unspecified: Secondary | ICD-10-CM | POA: Diagnosis not present

## 2022-05-12 DIAGNOSIS — Z6828 Body mass index (BMI) 28.0-28.9, adult: Secondary | ICD-10-CM | POA: Diagnosis not present

## 2022-05-12 MED ORDER — LIDOCAINE VISCOUS HCL 2 % MT SOLN: OROMUCOSAL | 0 refills | Status: DC

## 2022-05-12 NOTE — Progress Notes (Signed)
E-Visit for Sore Throat  We are sorry that you are not feeling well.  Here is how we plan to help!  Your symptoms indicate a likely viral infection (Pharyngitis).   Pharyngitis is inflammation in the back of the throat which can cause a sore throat, scratchiness and sometimes difficulty swallowing.   Pharyngitis is typically caused by a respiratory virus and will just run its course.  Please keep in mind that your symptoms could last up to 10 days.  For throat pain, we recommend over the counter oral pain relief medications such as acetaminophen or aspirin, or anti-inflammatory medications such as ibuprofen or naproxen sodium.  Topical treatments such as oral throat lozenges or sprays may be used as needed.  Avoid close contact with loved ones, especially the very young and elderly.  Remember to wash your hands thoroughly throughout the day as this is the number one way to prevent the spread of infection and wipe down door knobs and counters with disinfectant.  I have prescribed: Viscous Lidocaine 2% Swallow 1mL every 4-6 hours as needed for sore throat. Do not eat or drink anything for 15 minutes after using to allow medication to coat the throat.    After careful review of your answers, I would not recommend an antibiotic for your condition.  Antibiotics should not be used to treat conditions that we suspect are caused by viruses like the virus that causes the common cold or flu. However, some people can have Strep with atypical symptoms. You may need formal testing in clinic or office to confirm if your symptoms continue or worsen.  Providers prescribe antibiotics to treat infections caused by bacteria. Antibiotics are very powerful in treating bacterial infections when they are used properly.  To maintain their effectiveness, they should be used only when necessary.  Overuse of antibiotics has resulted in the development of super bugs that are resistant to treatment!    Home Care: Only take  medications as instructed by your medical team. Do not drink alcohol while taking these medications. A steam or ultrasonic humidifier can help congestion.  You can place a towel over your head and breathe in the steam from hot water coming from a faucet. Avoid close contacts especially the very young and the elderly. Cover your mouth when you cough or sneeze. Always remember to wash your hands.  Get Help Right Away If: You develop worsening fever or throat pain. You develop a severe head ache or visual changes. Your symptoms persist after you have completed your treatment plan.  Make sure you Understand these instructions. Will watch your condition. Will get help right away if you are not doing well or get worse.   Thank you for choosing an e-visit.  Your e-visit answers were reviewed by a board certified advanced clinical practitioner to complete your personal care plan. Depending upon the condition, your plan could have included both over the counter or prescription medications.  Please review your pharmacy choice. Make sure the pharmacy is open so you can pick up prescription now. If there is a problem, you may contact your provider through [employer] and have the prescription routed to another pharmacy.  Your safety is important to [place]. If you have drug allergies check your prescription carefully.   For the next 24 hours you can use MyChart to ask questions about today's visit, request a non-urgent call back, or ask for a work or school excuse. You will get an email in the next two days asking  about your experience. I hope that your e-visit has been valuable and will speed your recovery.  I have spent 5 minutes in review of e-visit questionnaire, review and updating patient chart, medical decision making and response to patient.   Mar Daring, PA-C

## 2022-05-29 ENCOUNTER — Encounter (HOSPITAL_COMMUNITY): Payer: Self-pay | Admitting: Psychiatry

## 2022-05-29 ENCOUNTER — Ambulatory Visit (INDEPENDENT_AMBULATORY_CARE_PROVIDER_SITE_OTHER): Payer: 59 | Admitting: Psychiatry

## 2022-05-29 DIAGNOSIS — E559 Vitamin D deficiency, unspecified: Secondary | ICD-10-CM | POA: Diagnosis not present

## 2022-05-29 DIAGNOSIS — F410 Panic disorder [episodic paroxysmal anxiety] without agoraphobia: Secondary | ICD-10-CM

## 2022-05-29 DIAGNOSIS — L659 Nonscarring hair loss, unspecified: Secondary | ICD-10-CM | POA: Diagnosis not present

## 2022-05-29 DIAGNOSIS — R0683 Snoring: Secondary | ICD-10-CM

## 2022-05-29 MED ORDER — SERTRALINE HCL 25 MG PO TABS: 25.0000 mg | ORAL_TABLET | Freq: Every day | ORAL | 1 refills | Status: DC

## 2022-05-29 NOTE — Progress Notes (Signed)
Psychiatric Initial Adult Assessment  Patient Identification: Jacob Barber MRN:  [id number] Date of Evaluation:  05/29/2022 Referral Source: PCP  Assessment:  Jacob Barber is a 37 y.o. 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  Patient reports onset of panic attacks starting in 2017 which was 7 years after his father had a quadruple bypass and when he experienced the symptoms of the panic attacks this was highly distressing for him.  He has been in the emergency department several times over the intervening years and had significant medical workup which was ultimately negative including stress tests, echocardiogram, EGD.  In the most recent emergency room visit he was given Ativan which had prompted cessation of panic symptoms (tachycardia and tachypnea).  Since this November emergency room visit, he has only needed Ativan for 5 times but has been effective each time.  Caffeine use is about 20 ounces of coffee each day (to 3 cups of coffee) and occasional caffeinated soda.  He does snore but feels refreshed when waking up and has not had this worked up.  He is not able to pinpoint at time of initial visit precursors or contributing factors to the panic attacks and has not done psychotherapy before.  He was amenable to doing psychotherapy to build this awareness to hopefully head off future panic attacks and start on a more effective daily medicine (BuSpar was ineffective) until there has been a period of remission from panic attacks.  Zoloft will be first trial as outlined in plan below.  Ordered TSH, total T3, free T4 to rule out hyperthyroidism given a higher metabolism with panic attacks and heat intolerance.  He has some hair loss and ordered vitamin D  level.  He has a remote use of cannabis that has been in sustained remission since his early 62s.  Follow-up in 1 month.  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   Subjective:  Chief Complaint:  Chief Complaint  Patient presents with   Panic disorder   Establish Care    History of Present Illness:  Had some panic attacks over the last few years, not sure what was causing it as he isn't a very anxious person. Thought it was heartburn but has led to ED visits a few times. Last time was in November 2023 and went to [facility] calmed it down. Has had thorough work up with stress tests, echo, and EGD. 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   Lives with son, 67, and new dog. Likes spending time with family and playing pool, most anything social. Still enjoys. Sleeps pretty well, used to work 3rd shift for 7 years but switched in July and has slept pretty well since. Snores and feels refreshed. 2-3 cups of coffee daily and 1 soda sometimes. Appetite is good, enjoys to eat. 3 meals with one snack. No binges.  No restriction or purging. Works out regularly. Sometimes struggles with guilt but tries to not let it weigh on him. Concentration adequate. No SI past or present.  Not much of a chronic worrier. Mostly finances and his son's well being. Doesn't dwell and prays on it. Heart history in his family (father with quadruple bypass in 2010) so when he gets panic attacks worries that is what is happening. Have happened with both being at home alone and in public around people. Has used ativan 4-5 times since the November episode. Does fine in crowds most of the time. No period of sleeplessness. No hallucinations. No paranoia.  Alcohol is usually none, will have 1-2 beers on a weekend day 1-2x per month. No tobacco products. No other drugs at present, quit smoking pot in his early 76s.    Past Psychiatric History:  Diagnoses: panic disorder Medication trials: ativan, buspar (ineffective), trazodone (ineffective) Previous psychiatrist/therapist: none Hospitalizations: none Suicide attempts: none SIB: none Hx of violence towards others: none Current access to guns: yes and secured Hx of abuse: none  Previous Psychotropic Medications: Yes   Substance Abuse History in the last 12 months:  No.  Past Medical History:  Past Medical History:  Diagnosis Date   Childhood asthma    GERD (gastroesophageal reflux disease)     Past Surgical History:  Procedure Laterality Date   ESOPHAGOGASTRODUODENOSCOPY N/A 05/08/2016   Procedure: ESOPHAGOGASTRODUODENOSCOPY (EGD);  Surgeon: Rogene Houston, MD;  Location: [facility];  Service: Endoscopy;  Laterality: N/A;  11:55    Family Psychiatric History: sister depression, father depression  Family History:  Family History  Problem Relation Age of Onset   CAD Father        Diagnosed in his 45s   Sleep apnea Father    Healthy Sister     Social History:   Social History   Socioeconomic History   Marital status: Single    Spouse name: Not on file    Number of children: Not on file   Years of education: Not on file   Highest education level: Not on file  Occupational History   Not on file  Tobacco Use   Smoking status: Former    Types: Cigarettes    Start date: 01/31/2012    Quit date: 02/23/2019    Years since quitting: 3.2   Smokeless tobacco: Never   Tobacco comments:    just on weekends in October 2023, denies use on 05/29/2022  Vaping Use   Vaping Use: Never used  Substance and Sexual Activity   Alcohol use: Yes    Comment: 1-2 beers on a weekend day once or twice a month   Drug use: Not Currently    Types: Marijuana    Comment: Stop cannabis use in his early 77s   Sexual activity: Not on file  Other Topics Concern   Not on file  Social History Narrative   Not on file   Social Determinants of Health   Financial Resource Strain: Not on file  Food Insecurity: Not on file  Transportation Needs: Not  on file  Physical Activity: Not on file  Stress: Not on file  Social Connections: Not on file    Additional Social History: see HPI  Allergies:   Allergies  Allergen Reactions   Erythromycin     hives   Sulfa Antibiotics     hives    Current Medications: Current Outpatient Medications  Medication Sig Dispense Refill   ibuprofen (ADVIL) 600 MG tablet Take 600 mg by mouth every 6 (six) hours as needed for mild pain.     acetaminophen (TYLENOL) 325 MG tablet Take 650 mg by mouth every 6 (six) hours as needed.     LORazepam (ATIVAN) 1 MG tablet Take 1 tablet (1 mg total) by mouth 3 (three) times daily as needed (panic attack). 15 tablet 0   No current facility-administered medications for this visit.    ROS: Review of Systems  Constitutional:  Negative for appetite change and unexpected weight change.  Cardiovascular:  Negative for palpitations.  Gastrointestinal:  Negative for constipation, diarrhea, nausea and vomiting.  Endocrine: Positive for heat intolerance. Negative for cold intolerance and polyphagia.   Musculoskeletal:  Negative for arthralgias and back pain.  Skin:        Hair loss  Psychiatric/Behavioral:  Negative for decreased concentration, dysphoric mood, hallucinations, self-injury, sleep disturbance and suicidal ideas. The patient is not nervous/anxious.     Objective:  Psychiatric Specialty Exam: There were no vitals taken for this visit.There is no height or weight on file to calculate BMI.  General Appearance: Casual, Neat, Well Groomed, and appears stated age  Eye Contact:  Good  Speech:  Clear and Coherent and Normal Rate  Volume:  Normal  Mood:   "fine just having these panic attacks"  Affect:  Appropriate, Congruent, Constricted, and slightly anxious  Thought Content: Logical and Hallucinations: None   Suicidal Thoughts:  No  Homicidal Thoughts:  No  Thought Process:  Coherent, Goal Directed, and Linear  Orientation:  Full (Time, Place, and Person)    Memory:  Immediate;   Good Recent;   Good Remote;   Good  Judgment:  Good  Insight:  Good  Concentration:  Concentration: Good and Attention Span: Good  Recall:  Good  Fund of Knowledge: Good  Language: Good  Psychomotor Activity:  Normal  Akathisia:  No  AIMS (if indicated): not done  Assets:  Communication Skills Desire for Improvement Financial Resources/Insurance Housing Leisure Time [place] Talents/Skills Transportation Vocational/Educational  ADL's:  Intact  Cognition: WNL  Sleep:  Fair   PE: General: sits comfortably in view of camera; no acute distress  Pulm: no increased work of breathing on room air  MSK: all extremity movements appear intact  Neuro: no focal neurological deficits observed  Gait & Station: unable to assess by video    Metabolic Disorder Labs: No results found for: "HGBA1C", "MPG" No results found for: "PROLACTIN" No results found for: "CHOL", "TRIG", "HDL", "CHOLHDL", "VLDL", "LDLCALC" No results found for: "TSH"  Therapeutic  Level Labs: No results found for: "LITHIUM" No results found for: "CBMZ" No results found for: "VALPROATE"  Screenings:  [place] Office Visit from 05/29/2022 in [place] at [facility] Total Score [place] Office Visit from 05/29/2022 in [place] at [facility] from 02/12/2022 in [facility] Emergency Department at [facility] ED from 07/12/2021 in [facility] at [place]  C-SSRS RISK CATEGORY No Risk No Risk No Risk       Collaboration of Care: Collaboration of Care: Medication Management AEB as above and Referral or follow-up with counselor/therapist AEB CBT referral  Patient/Guardian was advised Release of Information must be obtained prior to any record release in order to collaborate their care with an outside provider. Patient/Guardian was advised if they have not already done so to contact the registration department to sign all necessary forms in order for [place] to release information regarding their care.   Consent: Patient/Guardian gives verbal consent for treatment and assignment of benefits for services provided during this visit. Patient/Guardian expressed understanding and agreed to proceed.   Televisit via video: I connected with Jacob Barber on 05/29/22 at  8:00 AM EST by a video enabled telemedicine application and verified that I am speaking with the correct person using two identifiers.  Location: Patient: [place] Provider: home office   I discussed the limitations of evaluation and management by telemedicine and the availability of in person appointments. The patient expressed understanding and agreed to proceed.  I discussed the assessment and treatment plan with the patient. The patient was provided an opportunity to ask questions and all were answered. The patient agreed with the plan and demonstrated an understanding of the instructions.   The  patient was advised to call back or seek an in-person evaluation if the symptoms worsen or if the condition fails to improve as anticipated.  I provided 60 minutes of non-face-to-face time during this encounter.  Jacquelynn Cree, MD 2/9/20248:57 AM

## 2022-05-29 NOTE — Patient Instructions (Addendum)
We added Zoloft 25 mg once daily to her regimen today.  This will be a daily medication that we will use primarily to try and get your panic attacks in remission in combination with psychotherapy.  Once we have had a period of no panic attacks that would be the signal to probably stop this medicine.  I made a psychotherapy referral to try and build the awareness to figure out where the panic attacks are coming from in terms of the thoughts that you are having.  When he gets a chance, before you see me next month to please come by the lab to obtain a thyroid panel and vitamin D level.  A low hanging fruit that you could do to try and reduce the frequency of the panic attacks which are having his to cut back on the amount of caffeine you are consuming.  Caffeine is technically a stimulant and for folks with panic disorder this can make the panic attacks more frequent or worse.

## 2022-06-12 DIAGNOSIS — E559 Vitamin D deficiency, unspecified: Secondary | ICD-10-CM | POA: Diagnosis not present

## 2022-06-12 DIAGNOSIS — F410 Panic disorder [episodic paroxysmal anxiety] without agoraphobia: Secondary | ICD-10-CM | POA: Diagnosis not present

## 2022-06-12 DIAGNOSIS — L659 Nonscarring hair loss, unspecified: Secondary | ICD-10-CM | POA: Diagnosis not present

## 2022-06-13 LAB — VITAMIN D 25 HYDROXY (VIT D DEFICIENCY, FRACTURES): Vit D, 25-Hydroxy: 28 ng/mL — ABNORMAL LOW (ref 30–100)

## 2022-06-13 LAB — T4, FREE: Free T4: 1.1 ng/dL (ref 0.8–1.8)

## 2022-06-13 LAB — TSH: TSH: 1.4 mIU/L (ref 0.40–4.50)

## 2022-06-13 LAB — T3: T3, Total: 115 ng/dL (ref 76–181)

## 2022-06-24 ENCOUNTER — Telehealth (INDEPENDENT_AMBULATORY_CARE_PROVIDER_SITE_OTHER): Payer: 59 | Admitting: Psychiatry

## 2022-06-24 ENCOUNTER — Encounter (HOSPITAL_COMMUNITY): Payer: Self-pay | Admitting: Psychiatry

## 2022-06-24 DIAGNOSIS — L659 Nonscarring hair loss, unspecified: Secondary | ICD-10-CM

## 2022-06-24 DIAGNOSIS — R0683 Snoring: Secondary | ICD-10-CM

## 2022-06-24 DIAGNOSIS — F410 Panic disorder [episodic paroxysmal anxiety] without agoraphobia: Secondary | ICD-10-CM

## 2022-06-24 DIAGNOSIS — E559 Vitamin D deficiency, unspecified: Secondary | ICD-10-CM | POA: Diagnosis not present

## 2022-06-24 MED ORDER — ESCITALOPRAM OXALATE 10 MG PO TABS: 10.0000 mg | ORAL_TABLET | Freq: Every day | ORAL | 2 refills | Status: DC

## 2022-06-24 NOTE — Patient Instructions (Signed)
We discontinued Zoloft today due to the side effects you are having.  We replace it with Lexapro 10 mg once daily.  We should know in the next 2 weeks if this side effect resolves with switching to Lexapro so please send a MyChart or call the clinic to let me know.  You reach out to your PCP to discuss the vitamin D supplement that you are on and you may consider getting a sleep study referral from your PCP for the snoring.

## 2022-06-24 NOTE — Progress Notes (Signed)
[facility] MD Outpatient Progress Note  06/24/2022 10:56 AM Jacob Barber  MRN:  [id number]  Assessment:  Jacob Barber presents for follow-up evaluation. Today, 06/24/22, patient reports improved frequency of panic attacks with only one occurrence since initial appointment.  When it occurred it has in previous episodes there were no stressful circumstances and he was watching television his parents home.  The attack was less intense and shorter when it occurred but still lasted 30 minutes before he was able to return to baseline.  He is noticing sexual side effect with decreased desire on Zoloft so we will switch to Lexapro as outlined in plan to see if this resolves.  He still has 2 Ativan tablets remaining and did not require it during most recent panic attack.  Vitamin D was in the low and he started a supplement and was directed to his PCP to discuss further.  He will have his first psychotherapy appointment later this month.  Follow-up in 1 month.  Identifying Information: Jacob Barber is a 37 y.o. 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 Initial evaluation of panic attacks on 05/29/22; see that note for full case formulation.  Patient reported onset of panic attacks starting in 2017 which was 7 years after his father had a quadruple bypass and when he experienced the symptoms of the panic attacks this was highly distressing for him.  He had been assessed in the emergency department several times over the intervening years and had significant medical workup which was ultimately negative including stress tests, echocardiogram, EGD.  In the most recent emergency room visit he was given Ativan which had prompted cessation of panic symptoms (tachycardia and  tachypnea).  Since this November emergency room visit, he has only needed Ativan for 5 times but was effective each time.  Caffeine use is about 20 ounces of coffee each day (to 3 cups of coffee) and occasional caffeinated soda.  He does snore but feels refreshed when waking up and had not had sleep study.  He was not able to pinpoint at time of initial visit precursors or contributing factors to the panic attacks and had not done psychotherapy before.  He was amenable to doing psychotherapy to build this awareness to hopefully head off future panic attacks and start on a more effective daily medicine (BuSpar was ineffective) until there has been a period of remission from panic attacks.  Zoloft will be first trial as outlined in plan below.  Ordered TSH, total T3, free T4 to rule out hyperthyroidism given a higher metabolism with panic attacks and heat intolerance.  He had some hair loss and ordered vitamin D level.  He had a remote use of cannabis that was in sustained remission since his early 18s.  Follow-up in 1 month.   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   Subjective:  Chief Complaint:  Chief Complaint  Patient presents with   Panic Attack   Follow-up    Interval History: Things have been pretty good. Has had decreased sex drive and is not with anyone so not much of an issue; he and his girlfriend split in December. Had one panic attack since starting the zoloft (at the end of February) but wasn't as intense as before. Happened randomly with sitting at parents home about to order dinner. Driving around helped. The frequency also appears less. Caffeine down to 8oz of coffee per day with an occasional soft drink. Has started taking [place] vitamin d supplement from [employer] 11mg daily for the last 10 days. Hasn't used cigarettes in years. Will see psychotherapy  Visit Diagnosis:    ICD-10-CM   1. Panic disorder  F41.0 escitalopram (LEXAPRO) 10 MG tablet    2. Hair loss  L65.9     3. Snoring  R06.83     4. 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 Medication trials: ativan, buspar (ineffective), trazodone (ineffective), Zoloft (ieffective but sexual side effect) Previous psychiatrist/therapist: none Hospitalizations: none Suicide attempts: none SIB: none Hx of violence towards others: none Current access to guns: yes and secured Hx of abuse: none Substance use:  quit smoking pot in his early 278s    Past Medical History:  Past Medical History:  Diagnosis Date   Childhood asthma    GERD (gastroesophageal reflux disease)     Past Surgical History:  Procedure Laterality Date   ESOPHAGOGASTRODUODENOSCOPY N/A 05/08/2016   Procedure: ESOPHAGOGASTRODUODENOSCOPY (EGD);  Surgeon: NRogene Houston MD;  Location: [facility];  Service: Endoscopy;  Laterality: N/A;  11:55     Family Psychiatric History: sister depression, father depression   Family History:  Family History  Problem Relation Age of Onset   CAD Father        Diagnosed in his 560s  Sleep apnea Father    Healthy Sister     Social History:  Social History   Socioeconomic History   Marital status: Single    Spouse name: Not on file   Number of children: Not on file   Years of education: Not on file   Highest education level: Not on file  Occupational History   Not on file  Tobacco Use   Smoking status: Former    Types: Cigarettes    Start date: 01/31/2012    Quit date: 02/23/2019    Years since quitting: 3.3   Smokeless tobacco: Never   Tobacco comments:    just on weekends in October 2023, denies use on 05/29/2022  Vaping Use   Vaping Use: Never used  Substance and Sexual Activity   Alcohol use: Yes    Comment: 1-2 beers on a weekend day once or twice a month   Drug use: Not Currently    Types: Marijuana    Comment: Stop  cannabis use in his early 72s   Sexual activity: Not on file  Other Topics Concern   Not on file  Social History Narrative   Not on file   Social Determinants of Health   Financial Resource Strain: Not on file  Food Insecurity: Not on file  Transportation Needs: Not on file  Physical Activity: Not on file  Stress: Not on file  Social Connections: Not on file    Allergies:  Allergies  Allergen Reactions   Erythromycin     hives   Sulfa Antibiotics     hives    Current Medications: Current Outpatient Medications  Medication Sig Dispense Refill   escitalopram (LEXAPRO) 10 MG tablet Take 1 tablet (10 mg total) by mouth daily. 30 tablet 2   acetaminophen (TYLENOL) 325 MG tablet Take 650 mg by mouth every 6 (six) hours as needed.     ibuprofen (ADVIL) 600 MG tablet Take 600 mg by mouth every 6 (six) hours as needed for mild pain.     LORazepam (ATIVAN) 1 MG tablet Take 1 tablet (1 mg total) by mouth 3 (three) times daily as needed (panic  attack). 15 tablet 0   No current facility-administered medications for this visit.    ROS: Review of Systems  Constitutional:  Negative for appetite change and unexpected weight change.  Gastrointestinal:  Negative for constipation, diarrhea and nausea.  Musculoskeletal:  Negative for arthralgias.  Skin:        Hair loss  Neurological:  Negative for headaches.  Psychiatric/Behavioral:  Negative for dysphoric mood, hallucinations, sleep disturbance and suicidal ideas. The patient is nervous/anxious.     Objective:  Psychiatric Specialty Exam: There were no vitals taken for this visit.There is no height or weight on file to calculate BMI.  General Appearance: Casual, Fairly Groomed, and appears stated age  Eye Contact:  Good  Speech:  Clear and Coherent and Normal Rate  Volume:  Normal  Mood:   "Good"  Affect:  Appropriate, Congruent, Constricted, and euthymic  Thought Content: Logical and Hallucinations: None   Suicidal Thoughts:  No  Homicidal Thoughts:  No  Thought Process:  Coherent, Goal Directed, and Linear  Orientation:  Full (Time, Place, and Person)    Memory:  Immediate;   Good  Judgment:  Good  Insight:  Good  Concentration:  Concentration: Good and Attention Span: Good  Recall:  Good  Fund of Knowledge: Good  Language: Good  Psychomotor Activity:  Normal  Akathisia:  No  AIMS (if indicated): not done  Assets:  Communication Skills Desire for Improvement Financial Resources/Insurance Housing Leisure Time Physical Health Resilience Social Support Talents/Skills Transportation Vocational/Educational  ADL's:  Intact  Cognition: WNL  Sleep:  Good   PE: General: sits comfortably in view of camera; no acute distress  Pulm: no increased work of breathing on room air  MSK: all extremity movements appear intact  Neuro: no focal neurological deficits observed  Gait & Station: unable to assess by video    Metabolic Disorder Labs: No results found for:  "HGBA1C", "MPG" No results found for: "PROLACTIN" No results found for: "CHOL", "TRIG", "HDL", "CHOLHDL", "VLDL", "LDLCALC" Lab Results  Component Value Date   TSH 1.40 06/12/2022    Therapeutic Level Labs: No results found for: "LITHIUM" No results found for: "VALPROATE" No results found for: "CBMZ"  Screenings:  PHQ2-9    [place] Office Visit from 05/29/2022 in [place] at [facility] Total Score 0  [place] Office Visit from 05/29/2022 in [place] at [place] ED from 02/12/2022 in [facility] Emergency Department at [place] [facility] ED from 07/12/2021 in [facility] at [place] No Risk No Risk No Risk       Collaboration of Care: Collaboration of Care: Medication Management AEB as above, Primary Care Provider AEB as above, and Referral or follow-up with counselor/therapist AEB as above  Patient/Guardian was advised Release of Information must be obtained prior to any record release in order to collaborate their care with an outside provider. Patient/Guardian was advised if they have not already done so to contact the registration department to sign all necessary forms in order for [place] to release information regarding their care.   Consent: Patient/Guardian gives verbal consent for treatment and assignment of benefits for services provided during this visit. Patient/Guardian expressed understanding and agreed to proceed.   Televisit via video: I connected with Jacob Barber on 06/24/22 at 10:30 AM EST by a video enabled telemedicine application and verified that I am speaking with the correct person using two identifiers.  Location: Patient: At home Provider: [facility]   I discussed the limitations of evaluation and management by telemedicine and the availability of in person appointments. The patient expressed understanding and agreed to  proceed.  I discussed the assessment and treatment plan with the patient. The patient was provided an opportunity to ask questions and all were answered. The patient agreed with the plan and demonstrated an understanding of the instructions.   The patient was advised to call back or seek an in-person evaluation if the symptoms worsen or if the condition fails to improve as anticipated.  I provided 20 minutes of non-face-to-face time during this encounter.  Jacquelynn Cree, MD 06/24/2022, 10:56 AM

## 2022-07-13 ENCOUNTER — Ambulatory Visit (INDEPENDENT_AMBULATORY_CARE_PROVIDER_SITE_OTHER): Payer: 59 | Admitting: Clinical

## 2022-07-13 ENCOUNTER — Encounter (HOSPITAL_COMMUNITY): Payer: Self-pay

## 2022-07-13 DIAGNOSIS — F410 Panic disorder [episodic paroxysmal anxiety] without agoraphobia: Secondary | ICD-10-CM

## 2022-07-13 NOTE — Progress Notes (Signed)
Virtual Visit via Video Note  I connected with Jacob Barber on 07/13/22 at  1:00 PM EDT by a video enabled telemedicine application and verified that I am speaking with the correct person using two identifiers.  Location: Patient: Home Provider: Office   I discussed the limitations of evaluation and management by telemedicine and the availability of in person appointments. The patient expressed understanding and agreed to proceed.     Comprehensive Clinical Assessment (CCA) Note  07/13/2022 Jacob Barber [id number]  Chief Complaint: Panic Disorder Visit Diagnosis: Panic Disorder   CCA Screening, Triage and Referral (STR)  Patient Reported Information How did you hear about [place]? -- (Referred by the Emergency Department.)  Referral name: No data recorded Referral phone number: No data recorded  Whom do you see for routine medical problems? No data recorded Practice/Facility Name: No data recorded Practice/Facility Phone Number: No data recorded Name of Contact: No data recorded Contact Number: No data recorded Contact Fax Number: No data recorded Prescriber Name: No data recorded Prescriber Address (if known): No data recorded  What Is the Reason for Your Visit/Call Today? Screening/Triage completed. Patient is Routine. Please room patient.     Jacob Barber is a 37 yr old male that presents to the [facility] with a complaint of panic attacks. States that symptoms started in 2015. He initially thought it was medical related (heart burn). After medical clearance ("Stress test, Echo, Troponin test, etc.") over the years he was told that he was having anxiety. He describes his anxiety symptoms as "Chest pain, arm numbness, restless, etc.". The panic attacks tend to last up to 45 minutes. He recently had a panic attack 02/12/2022 leading to an Emergency Room visit to [facility]. He was medically cleared and given Ativan during that visit and a prescription. He describes another panic attack  occurring at work recently, took an [place], and it seemed to help relieve his symptoms. The most recent panic attack was 1 hour ago, prior to his arrival,  he took an Ativan, and it helped minimize his symptoms. States that his anxiety "comes I'm waves". Denies suicidal ideations, homicidal ideations, AVH's.  No substance use. No hx of outpatient psychiatric treatment. PCP has prescribed patient Buspirone as well.  How Long Has This Been Causing You Problems? > than 6 months  What Do You Feel Would Help You the Most Today? Medication(s)   Have You Recently Been in Any Inpatient Treatment (Hospital/Detox/Crisis Center/28-Day Program)? No data recorded Name/Location of Program/Hospital:No data recorded How Long Were You There? No data recorded When Were You Discharged? No data recorded  Have You Ever Received Services From [facility] Before? No data recorded Who Do You See at [facility]? No data recorded  Have You Recently Had Any Thoughts About Hurting Yourself? No  Are You Planning to Commit Suicide/Harm Yourself At This time? No   Have you Recently Had Thoughts About [place]? No  Explanation: No data recorded  Have You Used Any Alcohol or Drugs in the Past 24 Hours? No  How Long Ago Did You Use Drugs or Alcohol? No data recorded What Did You Use and How Much? No data recorded  Do You Currently Have a Therapist/Psychiatrist? No data recorded Name of Therapist/Psychiatrist: No data recorded  Have You Been Recently Discharged From Any Office Practice or Programs? No data recorded Explanation of Discharge From Practice/Program: No data recorded    CCA Screening Triage Referral Assessment Type of Contact: No data recorded Is this Initial  or Reassessment? No data recorded Date Telepsych consult ordered in [facility]:  No data recorded Time Telepsych consult ordered in [facility]:  No data recorded  Patient Reported Information Reviewed? No data recorded Patient Left Without  Being Seen? No data recorded Reason for Not Completing Assessment: No data recorded  Collateral Involvement: No data recorded  Does Patient Have a [place]? No data recorded Name and Contact of Legal Guardian: No data recorded If Minor and Not Living with Parent(s), Who has Custody? No data recorded Is CPS involved or ever been involved? No data recorded Is APS involved or ever been involved? No data recorded  Patient Determined To Be At Risk for Harm To Self or Others Based on Review of Patient Reported Information or Presenting Complaint? No data recorded Method: No data recorded Availability of Means: No data recorded Intent: No data recorded Notification Required: No data recorded Additional Information for Danger to Others Potential: No data recorded Additional Comments for Danger to Others Potential: No data recorded Are There Guns or Other Weapons in Your Home? No data recorded Types of Guns/Weapons: No data recorded Are These Weapons Safely Secured?                            No data recorded Who Could Verify You Are Able To Have These Secured: No data recorded Do You Have any Outstanding Charges, Pending Court Dates, Parole/Probation? No data recorded Contacted To Inform of Risk of Harm To Self or Others: No data recorded  Location of Assessment: No data recorded  Does Patient Present under Involuntary Commitment? No data recorded IVC Papers Initial File Date: No data recorded  [place] of Residence: No data recorded  Patient Currently Receiving the Following Services: No data recorded  Determination of Need: Routine (7 days)   Options For Referral: Medication Management; Outpatient Therapy     CCA Biopsychosocial Intake/Chief Complaint:  The patient was referred by Dr.Stinson who he sees for med management with indication of difficulty of Anxiety/Panic Attacks  Current Symptoms/Problems: The patient notes difficulty with Anxiety/ Panic  Attacks .   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   Type of Services Patient Feels are Needed: The patient is currently working with Dr. Nehemiah Settle for Med Management / Individual Therapy   Initial Clinical Notes/Concerns: The patient is currently working with Dr. Nehemiah Settle for med management   Mental Health Symptoms Depression:   Sleep (too much or little)   Duration of Depressive symptoms: NA  Mania:   None   Anxiety:    Sleep; Restlessness   Psychosis:  NA  Duration of Psychotic symptoms: NA  Trauma:   None   Obsessions:   None   Compulsions:   None   Inattention:   None   Hyperactivity/Impulsivity:   None   Oppositional/Defiant Behaviors:   None   Emotional Irregularity:   None   Other Mood/Personality Symptoms:   NA    Mental Status Exam Appearance and self-care  Stature:   Tall   Weight:   Average weight   Clothing:   Casual   Grooming:   Normal   Cosmetic use:   None   Posture/gait:   Normal   Motor activity:   Not Remarkable   Sensorium  Attention:  Normal   Concentration:   Anxiety interferes   Orientation:  Normal  Recall/memory:   Normal   Affect and Mood  Affect:   Appropriate   Mood:  Anxious  Relating  Eye contact:   Normal   Facial expression:   Anxious   Attitude toward examiner:   Cooperative   Thought and Language  Speech flow:  Normal   Thought content:   Appropriate to Mood and Circumstances   Preoccupation:   None   Hallucinations:   None   Organization:  Logical  Transport planner of Knowledge:   Good   Intelligence:   Average   Abstraction:   Normal   Judgement:   Good   Reality Testing:   Realistic   Insight:   Good   Decision Making:   Normal   Social  Functioning  Social Maturity:   Responsible   Social Judgement:   Normal   Stress  Stressors:   Museum/gallery curator; 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; Transitions (Broke up from 5year relationship around Christmas)   Coping Ability:   Normal   Skill Deficits:   None   Supports:   Family (Parents and sister .)     Religion: Religion/Spirituality Are You A Religious Person?: Yes What is Your Religious Affiliation?: Baptist How Might This Affect Treatment?: [place] - Protective Factor  Leisure/Recreation: Leisure / Recreation Do You Have Hobbies?: Yes Leisure and Hobbies: Physical Fitness  Exercise/Diet: Exercise/Diet Do You Exercise?: Yes What Type of Exercise Do You Do?: Run/Walk, Weight Training How Many Times a Week Do You Exercise?: 4-5 times a week Have You Gained or Lost A Significant Amount of Weight in the Past Six Months?: No Do You Follow a Special Diet?: No Do You Have Any Trouble Sleeping?: Yes Explanation of Sleeping Difficulties: The patient notes difficulty with waking up frequently   CCA Employment/Education Employment/Work Situation: Employment / Work Situation Employment Situation: Employed Where is Patient Currently Employed?: [place] has Patient Been Employed?: Since June of 2016 Are You Satisfied With Your Job?: Yes Do You Work More Than One Job?: No Work Stressors: None identified Patient's Job has Been Impacted by Current Illness: No What is the Longest Time Patient has Held a Job?: same as above Where was the Patient Employed at that Time?: same as above Has Patient ever Been in the [employer]?: No  Education: Education Is Patient Currently Attending School?: No Last Grade Completed: 12 Name of [place]: [employer] Did Teacher, adult education From [employer]?: Yes Did Physicist, medical?: Yes What Type of College Degree Do you Have?: Associates in [employer] for [place]. Did [place]?: No What Was Your Major?: NA Did You Have Any Special Interests In School?: NA Did You Have An Individualized Education Program (IIEP): No Did You Have Any Difficulty At School?: No Patient's Education Has Been Impacted by Current Illness: No   CCA Family/Childhood History Family and Relationship History: Family history Marital status: Single Are you sexually active?: No What is your sexual orientation?: Heterosexual Has your sexual activity been affected by drugs, alcohol, medication, or emotional stress?: NA Does patient have children?: Yes How many children?: 1 How is patient's relationship with their children?: The patient notes he is very close with his 35yr old son who splits time between the patient and his Mother.  Childhood History:  Childhood History By whom was/is the patient raised?: Both parents Additional childhood history information: No Additional  Description of patient's relationship with caregiver when they were a child: Good childhood no issues Patient's description of current relationship with people who raised him/her: Good with both Mom and Dad How were you disciplined when you got in trouble as a child/adolescent?: Grounding Does patient have siblings?: Yes Number of Siblings: 2 Description of patient's current relationship with siblings: The patient notes having 1 brother and 1 sister . The patient notes being close with both, however, identifies the brothers struggle with addication has been a strain for the family. Did patient suffer any verbal/emotional/physical/sexual abuse as a child?: No Did patient suffer from severe childhood neglect?: No Has patient ever been sexually abused/assaulted/raped as an adolescent or adult?: No Was the patient ever a victim of a crime or a disaster?: No Witnessed domestic violence?: No Has patient been affected by domestic violence as an adult?: No  Child/Adolescent Assessment:     CCA Substance  Use Alcohol/Drug Use: Alcohol / Drug Use Pain Medications: See MAR Prescriptions: See MAR Over the Counter: Ibprofin History of alcohol / drug use?: No history of alcohol / drug abuse Longest period of sobriety (when/how long): NA                         ASAM's:  Six Dimensions of Multidimensional Assessment  Dimension 1:  Acute Intoxication and/or Withdrawal Potential:      Dimension 2:  Biomedical Conditions and Complications:      Dimension 3:  Emotional, Behavioral, or Cognitive Conditions and Complications:     Dimension 4:  Readiness to Change:     Dimension 5:  Relapse, Continued use, or Continued Problem Potential:     Dimension 6:  Recovery/Living Environment:     ASAM Severity Score:    ASAM Recommended Level of Treatment:     Substance use Disorder (SUD)    Recommendations for Services/Supports/Treatments: Recommendations for Services/Supports/Treatments Recommendations For Services/Supports/Treatments: Individual Therapy, Medication Management  DSM5 Diagnoses: Patient Active Problem List   Diagnosis Date Noted   Panic disorder 05/29/2022   Hair loss 05/29/2022   Snoring 05/29/2022   Vitamin D deficiency 05/29/2022   Other chest pain 04/29/2016   SOB (shortness of breath) 01/31/2016   Asthma in adult 01/31/2016   Chest pain 01/31/2016   Fatigue 01/31/2016   GERD (gastroesophageal reflux disease) 01/30/2016    Patient Centered Plan: Patient is on the following Treatment Plan(s):  Panic Disorder   Referrals to Alternative Service(s): Referred to Alternative Service(s):   Place:   Date:   Time:    Referred to Alternative Service(s):   Place:   Date:   Time:    Referred to Alternative Service(s):   Place:   Date:   Time:    Referred to Alternative Service(s):   Place:   Date:   Time:      Collaboration of Care: Overview of patient involvement in Med Management program with Dr. Nehemiah Settle  Patient/Guardian was advised Release of Information must  be obtained prior to any record release in order to collaborate their care with an outside provider. Patient/Guardian was advised if they have not already done so to contact the registration department to sign all necessary forms in order for [place] to release information regarding their care.   Consent: Patient/Guardian gives verbal consent for treatment and assignment of benefits for services provided during this visit. Patient/Guardian expressed understanding and agreed to proceed.    I discussed the assessment and treatment plan with the patient.  The patient was provided an opportunity to ask questions and all were answered. The patient agreed with the plan and demonstrated an understanding of the instructions.   The patient was advised to call back or seek an in-person evaluation if the symptoms worsen or if the condition fails to improve as anticipated.  I provided 60 minutes of non-face-to-face time during this encounter.   Lennox Grumbles, LCSW  07/13/2022

## 2022-07-23 ENCOUNTER — Telehealth (HOSPITAL_COMMUNITY): Payer: 59 | Admitting: Psychiatry

## 2022-08-06 ENCOUNTER — Encounter (HOSPITAL_COMMUNITY): Payer: Self-pay | Admitting: Psychiatry

## 2022-08-06 ENCOUNTER — Telehealth (INDEPENDENT_AMBULATORY_CARE_PROVIDER_SITE_OTHER): Payer: 59 | Admitting: Psychiatry

## 2022-08-06 DIAGNOSIS — F410 Panic disorder [episodic paroxysmal anxiety] without agoraphobia: Secondary | ICD-10-CM | POA: Diagnosis not present

## 2022-08-06 DIAGNOSIS — E559 Vitamin D deficiency, unspecified: Secondary | ICD-10-CM | POA: Diagnosis not present

## 2022-08-06 MED ORDER — ESCITALOPRAM OXALATE 10 MG PO TABS: 10.0000 mg | ORAL_TABLET | Freq: Every day | ORAL | 2 refills | Status: DC

## 2022-08-06 NOTE — Progress Notes (Signed)
BH MD Outpatient Progress Note  08/06/2022 9:49 AM Jacob Barber  MRN:  [id number]  Assessment:  Jacob Barber presents for follow-up evaluation. Today, 08/06/22, patient reports no further panic attacks since starting Lexapro and no further sexual side effects since switching from Zoloft. He still has 2 Ativan tablets remaining and has not needed it since before last appointment.  He had initial assessment with psychotherapy and will try 1 more visit before deciding if he wants to continue with that or not.  Goal is to be off of medications if he can help it.  Follow-up in 3 months.  Identifying Information: Jacob Barber is a 37 y.o. 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 Initial evaluation of panic attacks on 05/29/22; see that note for full case formulation.  Patient reported onset of panic attacks starting in 2017 which was 7 years after his father had a quadruple bypass and when he experienced the symptoms of the panic attacks this was highly distressing for him.  He had been assessed in the emergency department several times over the intervening years and had significant medical workup which was ultimately negative including stress tests, echocardiogram, EGD.  In the most recent emergency room visit he was given Ativan which had prompted cessation of panic symptoms (tachycardia and tachypnea).  Since this November emergency room visit, he has only needed Ativan for 5 times but was effective each time.  Caffeine use is about 20 ounces of coffee each day (to 3 cups of coffee) and occasional caffeinated soda.  He does snore but feels refreshed when waking up and had not had sleep study.  He was not able to pinpoint at time of initial visit  precursors or contributing factors to the panic attacks and had not done psychotherapy before.  He was amenable to doing psychotherapy to build this awareness to hopefully head off future panic attacks and start on a more effective daily medicine (BuSpar was ineffective) until there has been a period of remission from panic attacks.  Zoloft will be first trial as outlined in plan below.  Ordered TSH, total T3, free T4 to rule out hyperthyroidism given a higher metabolism with panic attacks and heat intolerance.  He had some hair loss and ordered vitamin D level.  He had a remote use of cannabis that was in sustained remission since his early 47s.  He was noticing sexual side effect with decreased desire on Zoloft so switched to Lexapro.   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   Subjective:  Chief Complaint:  Chief Complaint  Patient presents with   Panic Attack   Follow-up    Interval History: Things have been pretty good since last appointment. No panic attacks since starting the lexapro. No longer having sexual side effects anymore either. Hasn't needed any ativan  since before last appointment. Caffeine still down to 8oz of coffee per day with an occasional soft drink. Will meet one more time with psychotherapy to see if he wants to continue.   Visit Diagnosis:    ICD-10-CM   1. Vitamin D deficiency  E55.9     2. 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    Past Medical History:  Past Medical History:  Diagnosis Date   Childhood asthma    GERD (gastroesophageal reflux disease)     Past Surgical History:  Procedure Laterality Date   ESOPHAGOGASTRODUODENOSCOPY N/A 05/08/2016   Procedure: ESOPHAGOGASTRODUODENOSCOPY (EGD);  Surgeon: Malissa Hippo, MD;  Location[facility];  Service: Endoscopy;  Laterality: N/A;  11:55    Family Psychiatric History: sister depression, father depression   Family History:  Family History  Problem Relation Age of Onset   CAD Father        Diagnosed in his 18s   Sleep apnea Father    Healthy Sister     Social History:  Social History   Socioeconomic History   Marital status: Single    Spouse name: Not on file   Number of children: Not on file   Years of education: Not on file   Highest education level: Not on file  Occupational History   Not on file  Tobacco Use   Smoking status: Former    Types: Cigarettes    Start date: 01/31/2012    Quit date: 02/23/2019    Years since quitting: 3.4   Smokeless tobacco: Never   Tobacco  comments:    just on weekends in October 2023, denies use on 05/29/2022  Vaping Use   Vaping Use: Never used  Substance and Sexual Activity   Alcohol use: Yes    Comment: 1-2 beers on a weekend day once or twice a month   Drug use: Not Currently    Types: Marijuana    Comment: Stop cannabis use in his early 36s   Sexual activity: Not on file  Other Topics Concern   Not on file  Social History Narrative   Not on file   Social Determinants of Health   Financial Resource Strain: Not on file  Food Insecurity: Not on file  Transportation Needs: Not on file  Physical Activity: Not on file  Stress: Not on file  Social Connections: Not on file    Allergies:  Allergies  Allergen Reactions   Erythromycin     hives   Sulfa Antibiotics     hives    Current Medications: Current Outpatient Medications  Medication Sig Dispense Refill   acetaminophen (TYLENOL) 325 MG tablet Take 650 mg by mouth  every 6 (six) hours as needed.     escitalopram (LEXAPRO) 10 MG tablet Take 1 tablet (10 mg total) by mouth daily. 30 tablet 2   ibuprofen (ADVIL) 600 MG tablet Take 600 mg by mouth every 6 (six) hours as needed for mild pain.     LORazepam (ATIVAN) 1 MG tablet Take 1 tablet (1 mg total) by mouth 3 (three) times daily as needed (panic attack). 15 tablet 0   No current facility-administered medications for this visit.    ROS: Review of Systems  Constitutional:  Negative for appetite change and unexpected weight change.  Gastrointestinal:  Negative for constipation, diarrhea and nausea.  Musculoskeletal:  Negative for arthralgias.  Skin:        Hair loss  Neurological:  Negative for headaches.  Psychiatric/Behavioral:  Negative for dysphoric mood, hallucinations, sleep disturbance and suicidal ideas. The patient is not nervous/anxious.     Objective:  Psychiatric Specialty Exam: There were no vitals taken for this visit.There is no height or weight on file to calculate BMI.  General  Appearance: Casual, Fairly Groomed, and appears stated age  Eye Contact:  Good  Speech:  Clear and Coherent and Normal Rate  Volume:  Normal  Mood:   "Pretty good"  Affect:  Appropriate, Congruent, Constricted, and euthymic  Thought Content: Logical and Hallucinations: None   Suicidal Thoughts:  No  Homicidal Thoughts:  No  Thought Process:  Coherent, Goal Directed, and Linear  Orientation:  Full (Time, Place, and Person)    Memory:  Immediate;   Good  Judgment:  Good  Insight:  Good  Concentration:  Concentration: Good and Attention Span: Good  Recall:  Good  Fund of Knowledge: Good  Language: Good  Psychomotor Activity:  Normal  Akathisia:  No  AIMS (if indicated): not done  Assets:  Communication Skills Desire for Improvement Financial Resources/Insurance Housing Leisure Time Physical Health Resilience Social Support Talents/Skills Transportation Vocational/Educational  ADL's:  Intact  Cognition: WNL  Sleep:  Good   PE: General: sits comfortably in view of camera; no acute distress  Pulm: no increased work of breathing on room air  MSK: all extremity movements appear intact  Neuro: no focal neurological deficits observed  Gait & Station: unable to assess by video    Metabolic Disorder Labs: No results found for: "HGBA1C", "MPG" No results found for: "PROLACTIN" No results found for: "CHOL", "TRIG", "HDL", "CHOLHDL", "VLDL", "LDLCALC" Lab Results  Component Value Date   TSH 1.40 06/12/2022    Therapeutic Level Labs: No results found for: "LITHIUM" No results found for: "VALPROATE" No results found for: "CBMZ"  Screenings:  GAD-7    Flowsheet Row Counselor from 07/13/2022 in [facility] [place]  Total GAD-7 Score 2      PHQ2-9    Flowsheet Row Office Visit from 05/29/2022 in [facility] Total Score 0      Flowsheet Row Counselor from 07/13/2022 in [facility] [place] Office Visit from 05/29/2022 in [facility] [place] ED from [facility]/12/2022 in [facility] at [facility]  C-SSRS RISK CATEGORY No Risk No Risk No Risk       Collaboration of Care: Collaboration of Care: Medication Management AEB as above, Primary Care Provider AEB as above, and Referral or follow-up with counselor/therapist AEB as above  Patient/Guardian was advised Release of Information must be obtained prior to any record  release in order to collaborate their care with an outside provider. Patient/Guardian was advised if they have not already done so to contact the registration department to sign all necessary forms in order for [place] to release information regarding their care.   Consent: Patient/Guardian gives verbal consent for treatment and assignment of benefits for services provided during this visit. Patient/Guardian expressed understanding and agreed to proceed.   Televisit via video: I connected with [place] on 08/06/22 at  9:30 AM EDT by a video enabled telemedicine application and verified that I am speaking with the correct person using two identifiers.  Location: Patient: At home Provider: [facility]   I discussed the limitations of evaluation and management by telemedicine and the availability of in person appointments. The patient expressed understanding and agreed to proceed.  I discussed the assessment and treatment plan with the patient. The patient was provided an opportunity to ask questions and all were answered. The patient agreed with the plan and demonstrated an understanding of the instructions.   The patient was advised to call back or seek an in-person evaluation if the symptoms worsen or if the condition fails to improve as anticipated.  I provided 10 minutes of non-face-to-face time during this encounter.  Elsie Lincoln, MD 08/06/2022, 9:49 AM

## 2022-08-06 NOTE — Patient Instructions (Signed)
We did not make any medication changes today but if we can have you symptom free for the next 6 months then we will look at decreasing and ultimately trying to come off of the Lexapro in the future.

## 2022-08-19 ENCOUNTER — Ambulatory Visit (INDEPENDENT_AMBULATORY_CARE_PROVIDER_SITE_OTHER): Payer: 59 | Admitting: Clinical

## 2022-08-19 DIAGNOSIS — F410 Panic disorder [episodic paroxysmal anxiety] without agoraphobia: Secondary | ICD-10-CM | POA: Diagnosis not present

## 2022-08-19 NOTE — Progress Notes (Addendum)
Virtual Visit via Video Note  I connected with Jacob Barber on 08/19/22 at  8:00 AM EDT by a video enabled telemedicine application and verified that I am speaking with the correct person using two identifiers.  Location: Patient: Home Provider: Office   I discussed the limitations of evaluation and management by telemedicine and the availability of in person appointments. The patient expressed understanding and agreed to proceed.  THERAPIST PROGRESS NOTE   Session Time: 8:00 AM-8:20 AM   Participation Level: Active   Behavioral Response: CasualAlertAnxious   Type of Therapy: Individual Therapy   Treatment Goals addressed: Coping Anger Management for Panic Disorder   Interventions: CBT, Motivational Interviewing, Solution Focused and Supportive   Summary: [name] is a 37 y.o. male who presents with Panic Disorder . The OPT therapist worked with the patient for his scheduled session. The OPT therapist utilized Motivational Interviewing to assist in creating therapeutic repore. The patient spoke about his recent med therapy appointment and noted . He feels the Lexapro he is currently taking is helping to manage his MH symptoms.The patient noted he has been under the weather over the course of the last few days and Is currently taking OTC medicine to assist with his cold symptoms. The patient spoke about working in the home doing remodeling for his bathroom, as well as looking forward to a upcoming planned family beach trip to [place] in July. The OPT therapist overviewed patient basic need areas including sleep cycle, eating habits, hygiene, and physical exercise. The OPT therapist overviewed coping strategies to further assist the patient in management of his MH symptoms. The OPT therapist over-viewed with the patient upcoming appointments as listed in his MyChart.    Suicidal/Homicidal: Nowithout intent/plan   Therapist Response: The OPT therapist worked with the patient  for the patients scheduled session. The patient was engaged in his session and gave feedback in relation to triggers, symptoms, and behavior responses over the past few weeks. The OPT therapist worked with the patient utilizing an in session Cognitive Behavioral Therapy exercise. The patient was responsive in the session and verbalized," Things are overall going better I don't want to jinks it but the medicine I feel is helping with the panic attacks," The OPT therapist worked with the patient overviewing his support symptoms and  implementation of coping strategies including leveraging seasonal coping strategies. The OPT therapist worked with the patient overviewing appointments listed in the patients MyChart.  The OPT therapist will continue treatment work with the patient in his next scheduled session.     Plan: Return again in 2/3 weeks.   Diagnosis:      Axis I: Panic Disorder                           Axis II: No diagnosis     Collaboration of Care: Overview of patient involvement in the med therapy program with Dr. Adrian Blackwater.   Patient/Guardian was advised Release of Information must be obtained prior to any record release in order to collaborate their care with an outside provider. Patient/Guardian was advised if they have not already done so to contact the registration department to sign all necessary forms in order for [place] to release information regarding their care.    Consent: Patient/Guardian gives verbal consent for treatment and assignment of benefits for services provided during this visit. Patient/Guardian expressed understanding and agreed to proceed.    I discussed the assessment and treatment  plan with the patient. The patient was provided an opportunity to ask questions and all were answered. The patient agreed with the plan and demonstrated an understanding of the instructions.   The patient was advised to call back or seek an in-person evaluation if the symptoms worsen or if the  condition fails to improve as anticipated.   I provided 20 minutes of non-face-to-face time during this encounter.   Winfred Burn, LCSW    08/19/2022

## 2022-09-24 ENCOUNTER — Ambulatory Visit (INDEPENDENT_AMBULATORY_CARE_PROVIDER_SITE_OTHER): Payer: 59 | Admitting: Clinical

## 2022-09-24 DIAGNOSIS — F410 Panic disorder [episodic paroxysmal anxiety] without agoraphobia: Secondary | ICD-10-CM | POA: Diagnosis not present

## 2022-09-24 NOTE — Progress Notes (Signed)
Virtual Visit via Video Note   I connected with Jacob Barber on 09/24/22 at  8:00 AM EDT by a video enabled telemedicine application and verified that I am speaking with the correct person using two identifiers.   Location: Patient: Home Provider: Office   I discussed the limitations of evaluation and management by telemedicine and the availability of in person appointments. The patient expressed understanding and agreed to proceed.   THERAPIST PROGRESS NOTE   Session Time: 8:00 AM-8:25 AM   Participation Level: Active   Behavioral Response: CasualAlertAnxious   Type of Therapy: Individual Therapy   Treatment Goals addressed: Coping Anger Management for Panic Disorder   Interventions: CBT, Motivational Interviewing, Solution Focused and Supportive   Summary: Jacob Barber is a 37 y.o. male who presents with Panic Disorder . The OPT therapist worked with the patient for his scheduled session. The OPT therapist utilized Motivational Interviewing to assist in creating therapeutic repore. 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 Additionally the patient spoke about his decision to sell his home and move with his sister onto land they are purchasing in the same city if the land passes the [place] testing. The patient noted he finished remodeling for his bathroom, but now is working on things at the home with a plan to sell it. The patient spoke about upcoming Summer plans including upcoming planned family beach trip to [place] in July and going to [employer] to watch fireworks for July 4th holiday. The OPT therapist overviewed patient basic need areas including sleep cycle, eating habits, hygiene, and physical exercise. The OPT therapist overviewed coping  strategies to further assist the patient in management of his MH symptoms. The OPT therapist over-viewed with the patient upcoming appointments as listed in his MyChart.    Suicidal/Homicidal: Nowithout intent/plan   Therapist Response: The OPT therapist worked with the patient for the patients scheduled session. The patient was engaged in his session and gave feedback in relation to triggers, symptoms, and behavior responses over the past few weeks. The OPT therapist worked with the patient utilizing an in session Cognitive Behavioral Therapy exercise. The patient was responsive in the session and verbalized," Things are overall have been better and more consistant and I am really hoping I get this promotion it has a lot of upsides including working closer to home and not having to travel to [place]" The OPT therapist worked with the patient overviewing his support symptoms and  implementation of coping strategies including leveraging seasonal coping strategies. The OPT therapist worked with the patient overviewing appointments listed in the patients MyChart.  The OPT therapist will continue treatment work with the patient in his next scheduled session.     Plan: Return again in 2/3 weeks.   Diagnosis:      Axis I: Panic Disorder                           Axis II: No diagnosis     Collaboration of Care: Overview of patient involvement in the med therapy program with Dr. Adrian Blackwater.   Patient/Guardian was advised Release of Information must be obtained prior to any record release in order to collaborate their care with an outside provider. Patient/Guardian  was advised if they have not already done so to contact the registration department to sign all necessary forms in order for [place] to release information regarding their care.    Consent: Patient/Guardian gives verbal consent for treatment and assignment of benefits for services provided during this visit. Patient/Guardian expressed understanding  and agreed to proceed.    I discussed the assessment and treatment plan with the patient. The patient was provided an opportunity to ask questions and all were answered. The patient agreed with the plan and demonstrated an understanding of the instructions.   The patient was advised to call back or seek an in-person evaluation if the symptoms worsen or if the condition fails to improve as anticipated.   I provided 25 minutes of non-face-to-face time during this encounter.   Winfred Burn, LCSW    09/24/2022

## 2022-10-26 ENCOUNTER — Ambulatory Visit (HOSPITAL_COMMUNITY): Payer: 59 | Admitting: Clinical

## 2022-12-09 ENCOUNTER — Telehealth (HOSPITAL_COMMUNITY): Payer: Self-pay | Admitting: Psychiatry

## 2022-12-09 NOTE — Telephone Encounter (Signed)
Patient called into the office with questions about side effects he's experiencing from the Lexapro.  Phone: [phone]

## 2022-12-14 DIAGNOSIS — Z1331 Encounter for screening for depression: Secondary | ICD-10-CM | POA: Diagnosis not present

## 2022-12-14 DIAGNOSIS — Z0001 Encounter for general adult medical examination with abnormal findings: Secondary | ICD-10-CM | POA: Diagnosis not present

## 2022-12-14 DIAGNOSIS — K219 Gastro-esophageal reflux disease without esophagitis: Secondary | ICD-10-CM | POA: Diagnosis not present

## 2022-12-14 DIAGNOSIS — R7989 Other specified abnormal findings of blood chemistry: Secondary | ICD-10-CM | POA: Diagnosis not present

## 2022-12-14 DIAGNOSIS — F410 Panic disorder [episodic paroxysmal anxiety] without agoraphobia: Secondary | ICD-10-CM | POA: Diagnosis not present

## 2022-12-14 DIAGNOSIS — R030 Elevated blood-pressure reading, without diagnosis of hypertension: Secondary | ICD-10-CM | POA: Diagnosis not present

## 2022-12-14 DIAGNOSIS — F419 Anxiety disorder, unspecified: Secondary | ICD-10-CM | POA: Diagnosis not present

## 2022-12-14 DIAGNOSIS — E782 Mixed hyperlipidemia: Secondary | ICD-10-CM | POA: Diagnosis not present

## 2022-12-14 DIAGNOSIS — Z6829 Body mass index (BMI) 29.0-29.9, adult: Secondary | ICD-10-CM | POA: Diagnosis not present

## 2022-12-14 DIAGNOSIS — R37 Sexual dysfunction, unspecified: Secondary | ICD-10-CM | POA: Diagnosis not present

## 2022-12-15 ENCOUNTER — Encounter (HOSPITAL_COMMUNITY): Payer: Self-pay | Admitting: Psychiatry

## 2022-12-15 ENCOUNTER — Telehealth (HOSPITAL_COMMUNITY): Payer: 59 | Admitting: Psychiatry

## 2022-12-15 DIAGNOSIS — N522 Drug-induced erectile dysfunction: Secondary | ICD-10-CM

## 2022-12-15 DIAGNOSIS — F410 Panic disorder [episodic paroxysmal anxiety] without agoraphobia: Secondary | ICD-10-CM | POA: Diagnosis not present

## 2022-12-15 MED ORDER — BUPROPION HCL ER (XL) 150 MG PO TB24: 150.0000 mg | ORAL_TABLET | ORAL | 2 refills | Status: DC

## 2022-12-15 NOTE — Patient Instructions (Signed)
We decreased the Lexapro to 5 mg daily for the next week.  If the ED resolved then you can just continue at that dose or if you want to trial off of the medication you can discontinue at that point.  If the panic attacks were to recur then you would start the Wellbutrin XL at 150 mg once daily.

## 2022-12-15 NOTE — Progress Notes (Signed)
BH MD Outpatient Progress Note  12/15/2022 3:37 PM [name]  MRN:  [id number]  Assessment:  Jacob Barber presents for follow-up evaluation. Today, 12/15/22, patient reports no further panic attacks since starting Lexapro but unfortunately the sexual side effects have returned similar to when he was on Zoloft.  Had a long discussion about benefits of switching from Lexapro to Wellbutrin but will trial 5 mg of Lexapro for the next week to see if this resolves ED symptoms and if not to fully discontinue at that point.  We will then trial off of Lexapro to see if even still needs it for the panic disorder which has not been present for the last 6 months at least.  If panic does recur we will plan on starting Wellbutrin XL 150 mg once daily.  Due to significant improvement which is attributable to change in job with more consistent hours being to better sleep as well as selling his house and buying some land will not plan on doing psychotherapy for the time being.  Goal is to be off of medications if he can help it.  He has not needed Ativan and has been out of medication for some time so fully discontinued that today.  Follow-up in 1 month.  Identifying Information: Jacob Barber is a 37 y.o. 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 Initial evaluation of panic attacks on 05/29/22; see that note for full case formulation.  Patient reported onset of panic attacks starting in 2017 which was 7 years after his father had a quadruple bypass and when he experienced the symptoms of the panic attacks this was highly distressing for him.  He had been assessed in the emergency department several times over the intervening years and had significant medical  workup which was ultimately negative including stress tests, echocardiogram, EGD.  In the most recent emergency room visit he was given Ativan which had prompted cessation of panic symptoms (tachycardia and tachypnea).  Since this November emergency room visit, he has only needed Ativan for 5 times but was effective each time.  Caffeine use is about 20 ounces of coffee each day (to 3 cups of coffee) and occasional caffeinated soda.  He does snore but feels refreshed when waking up and had not had sleep study.  He was not able to pinpoint at time of initial visit precursors or contributing factors to the panic attacks and had not done psychotherapy before.  He was amenable to doing psychotherapy to build this awareness to hopefully head off future panic attacks and start on a more effective daily medicine (BuSpar was ineffective) until there has been a period of remission from panic attacks.  Zoloft will be first trial as outlined in plan below.  Ordered TSH, total T3, free T4 to rule out hyperthyroidism given a higher metabolism with panic attacks and heat intolerance.  He had some hair loss and ordered vitamin D level.  He had a remote use of cannabis that was in sustained remission since his early 19s.  He was noticing sexual side effect with decreased desire on Zoloft so switched to Lexapro.   Plan:   # Panic disorder Past medication trials: See med trials below Status of problem: In early remission Interventions: --Taper Lexapro to 5 mg once daily  for 1 week then discontinue (s3/6/24, d8/27/24) --If panic attacks recur can start Wellbutrin XL 150 mg once daily --Cut back on caffeine  # Drug-induced erectile dysfunction Past medication trials:  Status of problem: Worsening Interventions: -- Discontinue Lexapro and switch to Wellbutrin as above   # Hair loss  vitamin D deficiency Past medication trials:  Status of problem: Chronic and stable Interventions: -- Continue vitamin D supplement    # Snoring  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   Subjective:  Chief Complaint:  Chief Complaint  Patient presents with   Panic Attack   Follow-up    Interval History: Running into some ED in the last month; had PCP appointment and recommended asking about Wellbutrin at today's follow-up. Outside of this things have been pretty good since last appointment. Bought some land, sold his house, and now working at [employer] with more regular hours so better sleep at night. No panic attacks since starting the lexapro. Hasn't needed any ativan since before last appointment. Caffeine still down to 8oz of coffee per day with an occasional soft drink.  With the improvements as above not planning on returning to psychotherapy for now.  We will trial a lower dose of Lexapro for the next week to see if this resolves ED symptoms but we will plan on discontinuing [place] to see if even still needs the Lexapro at this point.  Wellbutrin will be provided as a backup if panic attacks return.  He will continue to look for [facility] pharmacy to see if medications can be sent there.  Visit Diagnosis:    ICD-10-CM   1. 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    Past Medical History:  Past Medical History:  Diagnosis Date   Childhood asthma    GERD (gastroesophageal reflux disease)     Past Surgical History:  Procedure Laterality Date   ESOPHAGOGASTRODUODENOSCOPY N/A 05/08/2016   Procedure: ESOPHAGOGASTRODUODENOSCOPY (EGD);  Surgeon: Malissa Hippo, MD;  Location: [facility];  Service: Endoscopy;  Laterality: N/A;  11:55    Family Psychiatric History: sister depression, father depression   Family History:  Family History  Problem Relation Age of Onset   CAD Father        Diagnosed in his 57s   Sleep apnea Father    Healthy Sister     Social History:  Social History   Socioeconomic History   Marital status: Single    Spouse name: Not on file   Number of children: Not on file   Years of education: Not on file  Highest education level: Not on file  Occupational History   Not on file  Tobacco Use   Smoking status: Former    Current packs/day: 0.00    Types: Cigarettes    Start date: 01/31/2012    Quit date: 02/23/2019    Years since quitting: 3.8   Smokeless tobacco: Never   Tobacco comments:    just on weekends in October 2023, denies use on 05/29/2022  Vaping Use   Vaping status: Never Used  Substance and Sexual Activity   Alcohol use: Yes    Comment: 1-2 beers on a weekend day once or twice a month   Drug use: Not Currently    Types: Marijuana    Comment: Stop cannabis use in his early 67s   Sexual activity: Not on file  Other Topics Concern   Not on file  Social History Narrative   Not on file   Social Determinants of Health   Financial Resource Strain: Not on file  Food Insecurity: Not on file  Transportation Needs: Not  on file  Physical Activity: Not on file  Stress: Not on file  Social Connections: Not on file    Allergies:  Allergies  Allergen Reactions   Erythromycin     hives   Sulfa Antibiotics     hives    Current Medications: Current Outpatient Medications  Medication Sig Dispense Refill   buPROPion (WELLBUTRIN XL) 150 MG 24 hr tablet Take 1 tablet (150 mg total) by mouth every morning. 30 tablet 2   acetaminophen (TYLENOL) 325 MG tablet Take 650 mg by mouth every 6 (six) hours as needed.     escitalopram (LEXAPRO) 10 MG tablet Take 1 tablet (10 mg total) by mouth daily. 30 tablet 2   ibuprofen (ADVIL) 600 MG tablet Take 600 mg by mouth every 6 (six) hours as needed for mild pain.     No current facility-administered medications for this visit.    ROS: Review of Systems  Constitutional:  Negative for appetite change and unexpected weight change.  Gastrointestinal:  Negative for constipation, diarrhea and nausea.  Musculoskeletal:  Negative for arthralgias.  Skin:        Hair loss  Neurological:  Negative for headaches.  Psychiatric/Behavioral:  Negative for dysphoric mood, hallucinations, sleep disturbance and suicidal ideas. The patient is not nervous/anxious.     Objective:  Psychiatric Specialty Exam: There were no vitals taken for this visit.There is no height or weight on file to calculate BMI.  General Appearance: Casual, Fairly Groomed, and appears stated age  Eye Contact:  Good  Speech:  Clear and Coherent and Normal Rate  Volume:  Normal  Mood:   "Pretty good"  Affect:  Appropriate, Congruent, Constricted, and euthymic  Thought Content: Logical and Hallucinations: None   Suicidal Thoughts:  No  Homicidal Thoughts:  No  Thought Process:  Coherent, Goal Directed, and Linear  Orientation:  Full (Time, Place, and Person)    Memory:  Immediate;   Good  Judgment:  Good  Insight:  Good  Concentration:  Concentration: Good and Attention Span: Good  Recall:  Good   Fund of Knowledge: Good  Language: Good  Psychomotor Activity:  Normal  Akathisia:  No  AIMS (if indicated): not done  Assets:  Communication Skills Desire for Improvement Financial Resources/Insurance Housing Leisure Time Physical Health Resilience Social Support Talents/Skills Transportation Vocational/Educational  ADL's:  Intact  Cognition: WNL  Sleep:  Good   PE: General: sits comfortably in view of camera;  no acute distress  Pulm: no increased work of breathing on room air  MSK: all extremity movements appear intact  Neuro: no focal neurological deficits observed  Gait & Station: unable to assess by video    Metabolic Disorder Labs: No results found for: "HGBA1C", "MPG" No results found for: "PROLACTIN" No results found for: "CHOL", "TRIG", "HDL", "CHOLHDL", "VLDL", "LDLCALC" Lab Results  Component Value Date   TSH 1.40 06/12/2022    Therapeutic Level Labs: No results found for: "LITHIUM" No results found for: "VALPROATE" No results found for: "CBMZ"  Screenings:  GAD-7    Flowsheet Row Counselor from 07/13/2022 in [facility] [place]  Total GAD-7 Score 2      PHQ2-9    Flowsheet Row Office Visit from 05/29/2022 in [facility] Total Score 0      Flowsheet Row Counselor from 07/13/2022 in [facility] [place] Office Visit from 05[facility]29/2022 in [facility] [place] ED from 02/12/2022 in [facility]  C-SSRS RISK CATEGORY No Risk No Risk No Risk       Collaboration of Care: Collaboration of Care: Medication Management AEB as above, Primary Care Provider AEB as above, and Referral or follow-up with counselor/therapist AEB as above  Patient/Guardian was advised Release of Information must be obtained prior to any record release in order to collaborate their care with  an outside provider. Patient/Guardian was advised if they have not already done so to contact the registration department to sign all necessary forms in order for [place] to release information regarding their care.   Consent: Patient/Guardian gives verbal consent for treatment and assignment of benefits for services provided during this visit. Patient/Guardian expressed understanding and agreed to proceed.   Televisit via video: I connected with Pattrick on 12/15/22 at  3:00 PM EDT by a video enabled telemedicine application and verified that I am speaking with the correct person using two identifiers.  Location: Patient: at work [employer] Provider: [facility]   I discussed the limitations of evaluation and management by telemedicine and the availability of in person appointments. The patient expressed understanding and agreed to proceed.  I discussed the assessment and treatment plan with the patient. The patient was provided an opportunity to ask questions and all were answered. The patient agreed with the plan and demonstrated an understanding of the instructions.   The patient was advised to call back or seek an in-person evaluation if the symptoms worsen or if the condition fails to improve as anticipated.  I provided 30 minutes of virtual face-to-face time during this encounter.  Elsie Lincoln, MD 12/15/2022, 3:37 PM

## 2022-12-24 DIAGNOSIS — E7849 Other hyperlipidemia: Secondary | ICD-10-CM | POA: Diagnosis not present

## 2022-12-24 DIAGNOSIS — Z1329 Encounter for screening for other suspected endocrine disorder: Secondary | ICD-10-CM | POA: Diagnosis not present

## 2022-12-24 DIAGNOSIS — Z131 Encounter for screening for diabetes mellitus: Secondary | ICD-10-CM | POA: Diagnosis not present

## 2022-12-24 DIAGNOSIS — K219 Gastro-esophageal reflux disease without esophagitis: Secondary | ICD-10-CM | POA: Diagnosis not present

## 2022-12-24 DIAGNOSIS — R7989 Other specified abnormal findings of blood chemistry: Secondary | ICD-10-CM | POA: Diagnosis not present

## 2023-01-06 ENCOUNTER — Other Ambulatory Visit (HOSPITAL_COMMUNITY): Payer: Self-pay | Admitting: Psychiatry

## 2023-01-06 DIAGNOSIS — F410 Panic disorder [episodic paroxysmal anxiety] without agoraphobia: Secondary | ICD-10-CM

## 2023-01-18 ENCOUNTER — Encounter (HOSPITAL_COMMUNITY): Payer: Self-pay | Admitting: Psychiatry

## 2023-01-18 ENCOUNTER — Telehealth (INDEPENDENT_AMBULATORY_CARE_PROVIDER_SITE_OTHER): Payer: 59 | Admitting: Psychiatry

## 2023-01-18 DIAGNOSIS — F410 Panic disorder [episodic paroxysmal anxiety] without agoraphobia: Secondary | ICD-10-CM

## 2023-01-18 NOTE — Progress Notes (Signed)
BH MD Outpatient Progress Note  01/18/2023 2:17 PM Jacob Barber  MRN:  [id number]  Assessment:  Jacob Barber presents for follow-up evaluation. Today, 01/18/23, patient reports no further panic attacks since per starting and then discontinuing Lexapro.  Unfortunately the sexual side effects have not resolved despite discontinuing the Lexapro so may not actually been a medication side effect as when he was on Zoloft.  Encouraged him to discuss with PCP for urology referral as they did find hyperlipidemia with normal testosterone.  If panic does recur we will plan on starting Wellbutrin XL 150 mg once daily.  Due to significant improvement which is attributable to change in job with more consistent hours being to better sleep as well as selling his house and buying some land will not plan on doing psychotherapy for the time being.  Goal is to be off of medications if he can help it.  No follow-up planned as his panic disorder is in remission.  Identifying Information: Jacob Barber is a 37 y.o. 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 Initial evaluation of panic attacks on 05/29/22; see that note for full case formulation.  Patient reported onset of panic attacks starting in 2017 which was 7 years after his father had a quadruple bypass and when he experienced the symptoms of the panic attacks this was highly distressing for him.  He had been assessed in the emergency department several times over the intervening years and had significant medical workup which was ultimately negative including stress tests, echocardiogram, EGD.  In the most recent emergency room visit he was given Ativan which had prompted cessation of panic symptoms (tachycardia and  tachypnea).  Since this November emergency room visit, he has only needed Ativan for 5 times but was effective each time.  Caffeine use is about 20 ounces of coffee each day (to 3 cups of coffee) and occasional caffeinated soda.  He does snore but feels refreshed when waking up and had not had sleep study.  He was not able to pinpoint at time of initial visit precursors or contributing factors to the panic attacks and had not done psychotherapy before.  He was amenable to doing psychotherapy to build this awareness to hopefully head off future panic attacks and start on a more effective daily medicine (BuSpar was ineffective) until there has been a period of remission from panic attacks.  Zoloft will be first trial as outlined in plan below.  Ordered TSH, total T3, free T4 to rule out hyperthyroidism given a higher metabolism with panic attacks and heat intolerance.  He had some hair loss and ordered vitamin D level.  He had a remote use of cannabis that was in sustained remission since his early 12s.  He was noticing sexual side effect with decreased desire on Zoloft so switched to Lexapro.   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   Subjective:  Chief Complaint:  Chief Complaint  Patient presents with   Anxiety   Follow-up    Interval History: Things have been pretty good and not running into any issues. Never started wellbutrin. The ED has persisted in the absence of the lexapro. Had a testosterone workup which was normal but did find cholesterol was elevated. Ended up splitting with his new partner. Will see his PCP about a urology referral. No planned follow up for now but will call our clinic if panic returns.   Visit Diagnosis:    ICD-10-CM   1. 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    Past Medical History:  Past Medical History:  Diagnosis Date   Childhood asthma    GERD (gastroesophageal reflux disease)     Past Surgical History:  Procedure Laterality Date   ESOPHAGOGASTRODUODENOSCOPY N/A 05/08/2016   Procedure: ESOPHAGOGASTRODUODENOSCOPY (EGD);  Surgeon: Malissa Hippo, MD;  [facility];  Service: Endoscopy;  Laterality: N/A;  11:55    Family Psychiatric History: sister depression, father depression   Family History:  Family History  Problem Relation Age of Onset   CAD Father         Diagnosed in his 31s   Sleep apnea Father    Healthy Sister     Social History:  Social History   Socioeconomic History   Marital status: Single    Spouse name: Not on file   Number of children: Not on file   Years of education: Not on file   Highest education level: Not on file  Occupational History   Not on file  Tobacco Use   Smoking status: Former    Current packs/day: 0.00    Types: Cigarettes    Start date: 01/31/2012    Quit date: 02/23/2019    Years since quitting: 3.9   Smokeless tobacco: Never   Tobacco comments:    just on weekends in October 2023, denies use on 05/29/2022  Vaping Use   Vaping status: Never Used  Substance and Sexual Activity   Alcohol use: Yes    Comment: 1-2 beers on a weekend day once or twice a month   Drug use: Not Currently    Types: Marijuana    Comment: Stop cannabis use in his early 39s   Sexual activity: Not on file  Other Topics Concern   Not on file  Social History Narrative   Not on file   Social Determinants of Health  Financial Resource Strain: Not on file  Food Insecurity: Not on file  Transportation Needs: Not on file  Physical Activity: Not on file  Stress: Not on file  Social Connections: Not on file    Allergies:  Allergies  Allergen Reactions   Erythromycin     hives   Sulfa Antibiotics     hives    Current Medications: Current Outpatient Medications  Medication Sig Dispense Refill   acetaminophen (TYLENOL) 325 MG tablet Take 650 mg by mouth every 6 (six) hours as needed.     ibuprofen (ADVIL) 600 MG tablet Take 600 mg by mouth every 6 (six) hours as needed for mild pain.     No current facility-administered medications for this visit.    ROS: Review of Systems  Constitutional:  Negative for appetite change and unexpected weight change.  Gastrointestinal:  Negative for constipation, diarrhea and nausea.  Genitourinary:        Erectile dysfunction  Musculoskeletal:  Negative for arthralgias.   Skin:        Hair loss  Neurological:  Negative for headaches.  Psychiatric/Behavioral:  Negative for dysphoric mood, hallucinations, sleep disturbance and suicidal ideas. The patient is not nervous/anxious.     Objective:  Psychiatric Specialty Exam: There were no vitals taken for this visit.There is no height or weight on file to calculate BMI.  General Appearance: Casual, Fairly Groomed, and appears stated age  Eye Contact:  Good  Speech:  Clear and Coherent and Normal Rate  Volume:  Normal  Mood:   "Doing well"  Affect:  Appropriate, Congruent, Constricted, and euthymic  Thought Content: Logical and Hallucinations: None   Suicidal Thoughts:  No  Homicidal Thoughts:  No  Thought Process:  Coherent, Goal Directed, and Linear  Orientation:  Full (Time, Place, and Person)    Memory:  Immediate;   Good  Judgment:  Good  Insight:  Good  Concentration:  Concentration: Good and Attention Span: Good  Recall:  Good  Fund of Knowledge: Good  Language: Good  Psychomotor Activity:  Normal  Akathisia:  No  AIMS (if indicated): not done  Assets:  Communication Skills Desire for Improvement Financial Resources/Insurance Housing Leisure Time Physical Health Resilience Social Support Talents/Skills Transportation Vocational/Educational  ADL's:  Intact  Cognition: WNL  Sleep:  Good   PE: General: sits comfortably in view of camera; no acute distress  Pulm: no increased work of breathing on room air  MSK: all extremity movements appear intact  Neuro: no focal neurological deficits observed  Gait & Station: unable to assess by video    Metabolic Disorder Labs: No results found for: "HGBA1C", "MPG" No results found for: "PROLACTIN" No results found for: "CHOL", "TRIG", "HDL", "CHOLHDL", "VLDL", "LDLCALC" Lab Results  Component Value Date   TSH 1.40 06/12/2022    Therapeutic Level Labs: No results found for: "LITHIUM" No results found for: "VALPROATE" No results found  for: "CBMZ"  Screenings:  GAD-7    Flowsheet Row Counselor from 07/13/2022 in [facility] [place]  Total GAD-7 Score 2      PHQ2-9    Flowsheet Row Office Visit from 05/29/2022 in [facility] Total Score 0      Flowsheet Row Counselor from 07/13/2022 in [facility] [place] Office Visit from 05[facility]29/2022 in [facility] [place] ED from 02/12/2022 in [facility] at [facility]  C-SSRS RISK CATEGORY  No Risk No Risk No Risk       Collaboration of Care: Collaboration of Care: Medication Management AEB as above, Primary Care Provider AEB as above, and Referral or follow-up with counselor/therapist AEB as above  Patient/Guardian was advised Release of Information must be obtained prior to any record release in order to collaborate their care with an outside provider. Patient/Guardian was advised if they have not already done so to contact the registration department to sign all necessary forms in order for [place] to release information regarding their care.   Consent: Patient/Guardian gives verbal consent for treatment and assignment of benefits for services provided during this visit. Patient/Guardian expressed understanding and agreed to proceed.   Televisit via video: I connected with Jacob Barber on 01/18/23 at  2:00 PM EDT by a video enabled telemedicine application and verified that I am speaking with the correct person using two identifiers.  Location: Patient: at work [employer] Provider: [facility]   I discussed the limitations of evaluation and management by telemedicine and the availability of in person appointments. The patient expressed understanding and agreed to proceed.  I discussed the assessment and treatment plan with the patient. The patient was provided an opportunity to ask questions and  all were answered. The patient agreed with the plan and demonstrated an understanding of the instructions.   The patient was advised to call back or seek an in-person evaluation if the symptoms worsen or if the condition fails to improve as anticipated.  I provided 15 minutes of virtual face-to-face time during this encounter.  Elsie Lincoln, MD 01/18/2023, 2:17 PM

## 2023-01-18 NOTE — Patient Instructions (Signed)
As we discussed talk with your PCP about getting a urology referral as I would have expected be ED to get better with being off of the Lexapro by now.

## 2023-01-25 ENCOUNTER — Telehealth: Payer: 59 | Admitting: Emergency Medicine

## 2023-01-25 DIAGNOSIS — H60339 Swimmer's ear, unspecified ear: Secondary | ICD-10-CM | POA: Diagnosis not present

## 2023-01-25 MED ORDER — CIPROFLOXACIN-DEXAMETHASONE 0.3-0.1 % OT SUSP: 4.0000 [drp] | Freq: Two times a day (BID) | OTIC | 0 refills | Status: AC

## 2023-01-25 NOTE — Progress Notes (Signed)
E Visit for Ear Pain - Swimmer's Ear  We are sorry that you are not feeling well. Here is how we plan to help!  Based on what you have shared with me it looks like you have Swimmer's Ear.  Swimmer's ear is a redness or swelling, irritation, or infection of your outer ear canal. These symptoms usually occur within a few days of swimming. Your ear canal is a tube that goes from the opening of the ear to the eardrum.  When water stays in your ear canal, germs can grow.  This is a painful condition that often happens to children and swimmers of all ages.  It is not contagious and oral antibiotics are not required to treat uncomplicated swimmer's ear.  The usual symptoms include:    Itchiness inside the ear  Redness or a sense of swelling in the ear  Pain when the ear is tugged on when pressure is placed on the ear  Pus draining from the infected ear     I've prescribed CiproDex drops.  Please use as directed on the label.  In certain cases, swimmer's ear may progress to a more serious bacterial infection of the middle or inner ear.  If you have a fever 102 and up and significantly worsening symptoms, this could indicate a more serious infection moving to the middle/inner and needs face to face evaluation in an office by a provider.  Your symptoms should improve over the next 3 days and should resolve in about 7 days.  Be sure to complete ALL of your prescription.  HOME CARE: Wash your hands frequently. If you are prescribed an ear drop, do not place the tip of the bottle on your ear or touch it with your fingers. You can take Acetaminophen 650 mg every 4-6 hours as needed for pain.  If pain is severe or moderate, you can apply a heating pad (set on low) or hot water bottle (wrapped in a towel) to outer ear for 20 minutes.  This will also increase drainage. Avoid ear plugs Do not go swimming until the symptoms are gone Do not use Q-tips After showers, help the water run out by tilting your head  to one side.   GET HELP RIGHT AWAY IF: Fever is over 102.2 degrees. You develop progressive ear pain or hearing loss. Ear symptoms persist longer than 3 days after treatment.  MAKE SURE YOU: Understand these instructions. Will watch your condition. Will get help right away if you are not doing well or get worse.  TO PREVENT SWIMMER'S EAR: Use a bathing cap or custom fitted swim molds to keep your ears dry. Towel off after swimming to dry your ears. Tilt your head or pull your earlobes to allow the water to escape your ear canal. If there is still water in your ears, consider using a hairdryer on the lowest setting.  Thank you for choosing an e-visit.  Your e-visit answers were reviewed by a board certified advanced clinical practitioner to complete your personal care plan. Depending upon the condition, your plan could have included both over the counter or prescription medications.  Please review your pharmacy choice. Make sure the pharmacy is open so you can pick up the prescription now. If there is a problem, you may contact your provider through [employer] and have the prescription routed to another pharmacy.  Your safety is important to [place]. If you have drug allergies check your prescription carefully.   For the next 24 hours you  can use MyChart to ask questions about today's visit, request a non-urgent call back, or ask for a work or school excuse. You will get an email with a survey after your eVisit asking about your experience. We would appreciate your feedback. I hope that your e-visit has been valuable and will aid in your recovery.  Approximately 5 minutes was used in reviewing the patient's chart, questionnaire, prescribing medications, and documentation.

## 2023-03-04 ENCOUNTER — Ambulatory Visit: Payer: 59 | Admitting: Urology

## 2023-03-04 ENCOUNTER — Encounter: Payer: Self-pay | Admitting: Urology

## 2023-03-04 ENCOUNTER — Other Ambulatory Visit (HOSPITAL_COMMUNITY): Payer: Self-pay

## 2023-03-04 VITALS — BP 120/77 | HR 68 | Ht 73.0 in | Wt 214.0 lb

## 2023-03-04 DIAGNOSIS — N5201 Erectile dysfunction due to arterial insufficiency: Secondary | ICD-10-CM

## 2023-03-04 MED ORDER — SILDENAFIL CITRATE 20 MG PO TABS
20.0000 mg | ORAL_TABLET | Freq: Every day | ORAL | 99 refills | Status: DC | PRN
  Filled 2023-03-04: qty 30, 15d supply, fill #0
  Filled 2023-03-08 (×2): qty 30, 30d supply, fill #0

## 2023-03-04 MED ORDER — VARDENAFIL HCL 10 MG PO TABS
5.0000 mg | ORAL_TABLET | Freq: Every day | ORAL | 11 refills | Status: DC | PRN
  Filled 2023-03-04: qty 10, 10d supply, fill #0

## 2023-03-04 NOTE — Progress Notes (Signed)
Subjective: 1. Erectile dysfunction due to arterial insufficiency      Consult requested by Wille Glaser PA.  03/04/23: Jacob Barber is a 37 yo male who presents with a complaint of ED with some issues over the past year.   He started Lexapro earlier this year which aggravated with ED.  He has been off for a month but still has issues. He is a former smoker.  He has a good libido.  He has some difficulty obtaining and maintaining an erection.  He has no penile pain or curvature.  He has tried tadalafil in the past but had some sensations similar to a panic attack.  It did help the erections.  He is voiding well with an IPSS of 4 with nocturia x1.  ROS:  Review of Systems  Gastrointestinal:  Positive for heartburn.  Psychiatric/Behavioral:  The patient is nervous/anxious.     Allergies  Allergen Reactions   Erythromycin     hives   Sulfa Antibiotics     hives    Past Medical History:  Diagnosis Date   Childhood asthma    GERD (gastroesophageal reflux disease)     Past Surgical History:  Procedure Laterality Date   ESOPHAGOGASTRODUODENOSCOPY N/A 05/08/2016   Procedure: ESOPHAGOGASTRODUODENOSCOPY (EGD);  Surgeon: Malissa Hippo, MD;  [facility];  Service: Endoscopy;  Laterality: N/A;  11:55    Social History   Socioeconomic History   Marital status: Single    Spouse name: Not on file   Number of children: Not on file   Years of education: Not on file   Highest education level: Not on file  Occupational History   Not on file  Tobacco Use   Smoking status: Former    Current packs/day: 0.00    Types: Cigarettes    Start date: 01/31/2012    Quit date: 02/23/2019    Years since quitting: 4.0   Smokeless tobacco: Never   Tobacco comments:    just on weekends in October 2023, denies use on 05/29/2022  Vaping Use   Vaping status: Never Used  Substance and Sexual Activity   Alcohol use: Yes    Comment: 1-2 beers on a weekend day once or twice a month   Drug use: Not  Currently    Types: Marijuana    Comment: Stop cannabis use in his early 102s   Sexual activity: Not on file  Other Topics Concern   Not on file  Social History Narrative   Not on file   Social Determinants of Health   Financial Resource Strain: Not on file  Food Insecurity: Not on file  Transportation Needs: Not on file  Physical Activity: Not on file  Stress: Not on file  Social Connections: Not on file  Intimate Partner Violence: Not on file    Family History  Problem Relation Age of Onset   CAD Father        Diagnosed in his 49s   Sleep apnea Father    Healthy Sister     Anti-infectives: Anti-infectives (From admission, onward)    None       Current Outpatient Medications  Medication Sig Dispense Refill   sildenafil (REVATIO) 20 MG tablet Start with 1/2 to 1 tablet daily as needed, may take up to 5 tablets daily for a total of 100mg  if needed. 30 tablet PRN   acetaminophen (TYLENOL) 325 MG tablet Take 650 mg by mouth every 6 (six) hours as needed.     ibuprofen (ADVIL)  600 MG tablet Take 600 mg by mouth every 6 (six) hours as needed for mild pain.     No current facility-administered medications for this visit.     Objective: Vital signs in last 24 hours: BP 120/77   Pulse 68   Ht 6\' 1"  (1.854 m)   Wt 214 lb (97.1 kg)   BMI 28.23 kg/m   Intake/Output from previous day: No intake/output data recorded. Intake/Output this shift: @IOTHISSHIFT @   Physical Exam Vitals reviewed.  Constitutional:      Appearance: Normal appearance.  Cardiovascular:     Rate and Rhythm: Normal rate and regular rhythm.     Heart sounds: Normal heart sounds.  Pulmonary:     Effort: Pulmonary effort is normal. No respiratory distress.     Breath sounds: Normal breath sounds.  Abdominal:     General: Abdomen is flat.     Palpations: Abdomen is soft.     Hernia: No hernia is present.  Genitourinary:    Comments: Nl phallus with adequate meatus. Nl scrotum, testes and  epididymis.  Musculoskeletal:        General: No swelling. Normal range of motion.  Skin:    General: Skin is warm and dry.  Neurological:     General: No focal deficit present.     Mental Status: He is alert and oriented to person, place, and time.     Lab Results:  No results found for this or any previous visit (from the past 24 hour(s)).  BMET No results for input(s): "NA", "K", "CL", "CO2", "GLUCOSE", "BUN", "CREATININE", "CALCIUM" in the last 72 hours. PT/INR No results for input(s): "LABPROT", "INR" in the last 72 hours. ABG No results for input(s): "PHART", "HCO3" in the last 72 hours.  Invalid input(s): "PCO2", "PO2"  Studies/Results: No results found.   Assessment/Plan: Erectile dysfunction.  He has mild ED and has responded to tadalafil but had side effects.   I was going to try him on lower dose vardenafil but that is not on formulary and is too expensive.   I will try low dose sildenafil and if he wants to try the vardenafil later we can have him try good Rx.   I also suggested he consider Eroxon cream.   Meds ordered this encounter  Medications   DISCONTD: vardenafil (LEVITRA) 10 MG tablet    Sig: Take 1/2-1 tablet (5-10 mg total) by mouth daily as needed for erectile dysfunction.    Dispense:  10 tablet    Refill:  11   sildenafil (REVATIO) 20 MG tablet    Sig: Start with 1/2 to 1 tablet daily as needed, may take up to 5 tablets daily for a total of 100mg  if needed.    Dispense:  30 tablet    Refill:  PRN     No orders of the defined types were placed in this encounter.    Return in about 3 months (around 06/04/2023).    CC: CC; Wille Glaser PA.      Bjorn Pippin 03/05/2023

## 2023-03-08 ENCOUNTER — Other Ambulatory Visit (HOSPITAL_COMMUNITY): Payer: Self-pay

## 2023-03-08 ENCOUNTER — Other Ambulatory Visit: Payer: Self-pay

## 2023-05-05 ENCOUNTER — Encounter (HOSPITAL_COMMUNITY): Payer: Self-pay

## 2023-05-05 ENCOUNTER — Other Ambulatory Visit (HOSPITAL_COMMUNITY): Payer: Self-pay

## 2023-05-05 ENCOUNTER — Telehealth: Payer: 59 | Admitting: Physician Assistant

## 2023-05-05 DIAGNOSIS — J4521 Mild intermittent asthma with (acute) exacerbation: Secondary | ICD-10-CM | POA: Diagnosis not present

## 2023-05-05 MED ORDER — BENZONATATE 100 MG PO CAPS
100.0000 mg | ORAL_CAPSULE | Freq: Three times a day (TID) | ORAL | 0 refills | Status: DC | PRN
  Filled 2023-05-05: qty 30, 10d supply, fill #0

## 2023-05-05 MED ORDER — PREDNISONE 20 MG PO TABS
40.0000 mg | ORAL_TABLET | Freq: Every day | ORAL | 0 refills | Status: DC
  Filled 2023-05-05: qty 10, 5d supply, fill #0

## 2023-05-05 NOTE — Progress Notes (Signed)
 I have spent 5 minutes in review of e-visit questionnaire, review and updating patient chart, medical decision making and response to patient.   Piedad Climes, PA-C

## 2023-05-05 NOTE — Progress Notes (Signed)
 E-Visit for Cough   We are sorry that you are not feeling well.  Here is how we plan to help!  Based on your presentation I believe you most likely have A cough due to a virus.  This is called viral bronchitis and is best treated by rest, plenty of fluids and control of the cough.  You may use Ibuprofen or Tylenol as directed to help your symptoms.  This is likely affecting asthma and causing bronchospasm. As such I have prescribed a short course of prednisone  to take as directed along with a prescription cough medication.   From your responses in the eVisit questionnaire you describe inflammation in the upper respiratory tract which is causing a significant cough.  This is commonly called Bronchitis and has four common causes:   Allergies Viral Infections Acid Reflux Bacterial Infection Allergies, viruses and acid reflux are treated by controlling symptoms or eliminating the cause. An example might be a cough caused by taking certain blood pressure medications. You stop the cough by changing the medication. Another example might be a cough caused by acid reflux. Controlling the reflux helps control the cough.  USE OF BRONCHODILATOR ("RESCUE") INHALERS: There is a risk from using your bronchodilator too frequently.  The risk is that over-reliance on a medication which only relaxes the muscles surrounding the breathing tubes can reduce the effectiveness of medications prescribed to reduce swelling and congestion of the tubes themselves.  Although you feel brief relief from the bronchodilator inhaler, your asthma may actually be worsening with the tubes becoming more swollen and filled with mucus.  This can delay other crucial treatments, such as oral steroid medications. If you need to use a bronchodilator inhaler daily, several times per day, you should discuss this with your provider.  There are probably better treatments that could be used to keep your asthma under control.     HOME CARE Only  take medications as instructed by your medical team. Complete the entire course of an antibiotic. Drink plenty of fluids and get plenty of rest. Avoid close contacts especially the very young and the elderly Cover your mouth if you cough or cough into your sleeve. Always remember to wash your hands A steam or ultrasonic humidifier can help congestion.   GET HELP RIGHT AWAY IF: You develop worsening fever. You become short of breath You cough up blood. Your symptoms persist after you have completed your treatment plan MAKE SURE YOU  Understand these instructions. Will watch your condition. Will get help right away if you are not doing well or get worse.    Thank you for choosing an e-visit.  Your e-visit answers were reviewed by a board certified advanced clinical practitioner to complete your personal care plan. Depending upon the condition, your plan could have included both over the counter or prescription medications.  Please review your pharmacy choice. Make sure the pharmacy is open so you can pick up prescription now. If there is a problem, you may contact your provider through [employer] and have the prescription routed to another pharmacy.  Your safety is important to us . If you have drug allergies check your prescription carefully.   For the next 24 hours you can use MyChart to ask questions about today's visit, request a non-urgent call back, or ask for a work or school excuse. You will get an email in the next two days asking about your experience. I hope that your e-visit has been valuable and will speed your recovery.

## 2023-05-10 ENCOUNTER — Telehealth: Payer: 59 | Admitting: Family Medicine

## 2023-05-10 DIAGNOSIS — B9689 Other specified bacterial agents as the cause of diseases classified elsewhere: Secondary | ICD-10-CM

## 2023-05-10 MED ORDER — DOXYCYCLINE HYCLATE 100 MG PO TABS: 100.0000 mg | ORAL_TABLET | Freq: Two times a day (BID) | ORAL | 0 refills | Status: AC

## 2023-05-10 NOTE — Progress Notes (Signed)
E-Visit for Cough   We are sorry that you are not feeling well.  Here is how we plan to help!  Based on your presentation I believe you most likely have A cough due to bacteria.  When patients have a fever and a productive cough with a change in color or increased sputum production, we are concerned about bacterial bronchitis.  If left untreated it can progress to pneumonia.  If your symptoms do not improve with your treatment plan it is important that you contact your provider.   I have prescribed Doxycycline 100 mg twice a day for 7 days     In addition you may use A non-prescription cough medication called Mucinex DM: take 2 tablets every 12 hours.  From your responses in the eVisit questionnaire you describe inflammation in the upper respiratory tract which is causing a significant cough.  This is commonly called Bronchitis and has four common causes:   Allergies Viral Infections Acid Reflux Bacterial Infection Allergies, viruses and acid reflux are treated by controlling symptoms or eliminating the cause. An example might be a cough caused by taking certain blood pressure medications. You stop the cough by changing the medication. Another example might be a cough caused by acid reflux. Controlling the reflux helps control the cough.  USE OF BRONCHODILATOR ("RESCUE") INHALERS: There is a risk from using your bronchodilator too frequently.  The risk is that over-reliance on a medication which only relaxes the muscles surrounding the breathing tubes can reduce the effectiveness of medications prescribed to reduce swelling and congestion of the tubes themselves.  Although you feel brief relief from the bronchodilator inhaler, your asthma may actually be worsening with the tubes becoming more swollen and filled with mucus.  This can delay other crucial treatments, such as oral steroid medications. If you need to use a bronchodilator inhaler daily, several times per day, you should discuss this with  your provider.  There are probably better treatments that could be used to keep your asthma under control.     HOME CARE Only take medications as instructed by your medical team. Complete the entire course of an antibiotic. Drink plenty of fluids and get plenty of rest. Avoid close contacts especially the very young and the elderly Cover your mouth if you cough or cough into your sleeve. Always remember to wash your hands A steam or ultrasonic humidifier can help congestion.   GET HELP RIGHT AWAY IF: You develop worsening fever. You become short of breath You cough up blood. Your symptoms persist after you have completed your treatment plan MAKE SURE YOU  Understand these instructions. Will watch your condition. Will get help right away if you are not doing well or get worse.    Thank you for choosing an e-visit.  Your e-visit answers were reviewed by a board certified advanced clinical practitioner to complete your personal care plan. Depending upon the condition, your plan could have included both over the counter or prescription medications.  Please review your pharmacy choice. Make sure the pharmacy is open so you can pick up prescription now. If there is a problem, you may contact your provider through [employer] and have the prescription routed to another pharmacy.  Your safety is important to [place]. If you have drug allergies check your prescription carefully.   For the next 24 hours you can use MyChart to ask questions about today's visit, request a non-urgent call back, or ask for a work or school excuse. You will get  an email in the next two days asking about your experience. I hope that your e-visit has been valuable and will speed your recovery.  I provided 5 minutes of non face-to-face time during this encounter for chart review, medication and order placement, as well as and documentation.

## 2023-06-14 ENCOUNTER — Encounter (HOSPITAL_COMMUNITY): Payer: Self-pay

## 2023-06-14 DIAGNOSIS — F410 Panic disorder [episodic paroxysmal anxiety] without agoraphobia: Secondary | ICD-10-CM

## 2023-06-15 MED ORDER — ESCITALOPRAM OXALATE 5 MG PO TABS: 5.0000 mg | ORAL_TABLET | Freq: Every day | ORAL | 1 refills | Status: DC

## 2023-06-15 NOTE — Telephone Encounter (Signed)
 Dr John Giovanni pt

## 2023-07-08 ENCOUNTER — Ambulatory Visit: Payer: 59 | Admitting: Urology

## 2023-07-12 ENCOUNTER — Telehealth: Admitting: Physician Assistant

## 2023-07-12 DIAGNOSIS — B001 Herpesviral vesicular dermatitis: Secondary | ICD-10-CM

## 2023-07-12 MED ORDER — VALACYCLOVIR HCL 1 G PO TABS: 2000.0000 mg | ORAL_TABLET | Freq: Two times a day (BID) | ORAL | 0 refills | Status: AC

## 2023-07-12 NOTE — Progress Notes (Signed)

## 2023-07-13 ENCOUNTER — Telehealth (INDEPENDENT_AMBULATORY_CARE_PROVIDER_SITE_OTHER): Payer: 59 | Admitting: Psychiatry

## 2023-07-13 ENCOUNTER — Encounter (HOSPITAL_COMMUNITY): Payer: Self-pay | Admitting: Psychiatry

## 2023-07-13 DIAGNOSIS — N522 Drug-induced erectile dysfunction: Secondary | ICD-10-CM | POA: Diagnosis not present

## 2023-07-13 DIAGNOSIS — F410 Panic disorder [episodic paroxysmal anxiety] without agoraphobia: Secondary | ICD-10-CM | POA: Diagnosis not present

## 2023-07-13 DIAGNOSIS — N529 Male erectile dysfunction, unspecified: Secondary | ICD-10-CM | POA: Insufficient documentation

## 2023-07-13 HISTORY — DX: Male erectile dysfunction, unspecified: N52.9

## 2023-07-13 MED ORDER — VORTIOXETINE HBR 5 MG PO TABS: 5.0000 mg | ORAL_TABLET | Freq: Every day | ORAL | 2 refills | Status: DC

## 2023-07-13 NOTE — Patient Instructions (Signed)
 We discontinued Lexapro due to the recurrence of side effects and we will trial vortioxetine (Trintellix) 5 mg once daily.  This is a newer medication so if the cost is excessive please let me know and we can try different medication.  The name of the heart monitoring test that would be most beneficial to get is a Zio patch and you should be able to ask your PCP about this.

## 2023-07-13 NOTE — Progress Notes (Signed)
 [facility] MD Outpatient Progress Note  07/13/2023 1:55 PM Jacob Barber  MRN:  [id number]  Assessment:  Jacob Barber presents for follow-up evaluation. Today, 07/13/23, patient reports unfortunately having recurrence of panic disorder after being awoken from sleep by panic attack at the end of February but has not had any since September 2024.  From my chart messages at that time restarted Lexapro and while the panic attacks have been improving this was also in the setting of stopping caffeine use which had resumed previously.  Unfortunately also having sexual side effects again with Lexapro use and with possibility this is cardiac in origin we will try to get Zio patch through PCP before trial of Wellbutrin.  In the meantime will trial Trintellix as this has better data for erectile dysfunction when compared to SSRIs and SNRIs.  It should also be noted that the sexual side effects did not resolve previously despite discontinuing the Lexapro and also preceded SSRI use so may not actually been a medication side effect as when he was on Zoloft.  Urology referral was unrevealing. Goal is to be off of medications if he can help it.  Follow-up in 1 month.  Identifying Information: Jacob Barber is a 38 y.o. 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 Initial evaluation of panic attacks on 05/29/22; see that note for full case formulation.  Patient reported onset of panic attacks starting in 2017 which was 7 years after his father had a quadruple bypass and when he experienced the symptoms of the panic attacks this was highly distressing for him.  He had been assessed in the emergency department several times over the intervening years and had significant medical  workup which was ultimately negative including stress tests, echocardiogram, EGD.  In the most recent emergency room visit he was given Ativan which had prompted cessation of panic symptoms (tachycardia and tachypnea).  Since this November emergency room visit, he has only needed Ativan for 5 times but was effective each time.  Caffeine use is about 20 ounces of coffee each day (to 3 cups of coffee) and occasional caffeinated soda.  He does snore but feels refreshed when waking up and had not had sleep study.  He was not able to pinpoint at time of initial visit precursors or contributing factors to the panic attacks and had not done psychotherapy before.  He was amenable to doing psychotherapy to build this awareness to hopefully head off future panic attacks and start on a more effective daily medicine (BuSpar was ineffective) until there has been a period of remission from panic attacks.  Zoloft will be first trial as outlined in plan below.  Ordered TSH, total T3, free T4 to rule out hyperthyroidism given a higher metabolism with panic attacks and heat intolerance.  He had some hair loss and ordered vitamin D level.  He had a remote use of cannabis that was in sustained remission since his early 15s.  He was noticing sexual side effect with decreased desire on Zoloft so switched to Lexapro.   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   Subjective:  Chief Complaint:  Chief Complaint  Patient presents with   Follow-up   Panic Attack    Interval History: Restarting the lexapro inbetween appointments was effective but the sexual side effects has returned quickly. Had been without panic attacks from September to end of February. Had 3 in one day with the first being while asleep. Had been drinking caffeine again prior to it and since switched off of caffeine again. Reviewed benefit of Zio patch and safer course to have this data before trial of Wellbutrin. Unfortunately the urology referral was not particularly helpful and sildenafil led to side effects with panic attack.   Visit Diagnosis:    ICD-10-CM   1. Panic disorder  F41.0 vortioxetine HBr (TRINTELLIX) 5 MG TABS tablet    2. 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    Past Medical History:  Past Medical History:  Diagnosis Date   Childhood asthma    GERD (gastroesophageal reflux disease)      Past Surgical History:  Procedure Laterality Date   ESOPHAGOGASTRODUODENOSCOPY N/A 05/08/2016   Procedure: ESOPHAGOGASTRODUODENOSCOPY (EGD);  Surgeon: Malissa Hippo, MD;  Location: [facility];  Service: Endoscopy;  Laterality: N/A;  11:55    Family Psychiatric History: sister depression, father depression   Family History:  Family History  Problem Relation Age of Onset   CAD Father        Diagnosed in his 47s   Sleep apnea Father    Healthy Sister     Social History:  Social History   Socioeconomic History   Marital status: Single    Spouse name: Not on file   Number of children: Not on file   Years of education: Not on file   Highest education level: Not on file  Occupational History   Not on file  Tobacco Use   Smoking status: Former    Current packs/day: 0.00    Types: Cigarettes    Start date: 01/31/2012    Quit date: 02/23/2019    Years since quitting: 4.3   Smokeless tobacco: Never   Tobacco comments:    just on weekends in October 2023, denies use on 05/29/2022  Vaping Use   Vaping status: Never Used  Substance and Sexual Activity   Alcohol use: Yes  Comment: 1-2 beers on a weekend day once or twice a month   Drug use: Not Currently    Types: Marijuana    Comment: Stop cannabis use in his early 54s   Sexual activity: Not on file  Other Topics Concern   Not on file  Social History Narrative   Not on file   Social Drivers of Health   Financial Resource Strain: Not on file  Food Insecurity: Not on file  Transportation Needs: Not on file  Physical Activity: Not on file  Stress: Not on file  Social Connections: Not on file    Allergies:  Allergies  Allergen Reactions   Erythromycin     hives   Sulfa Antibiotics     hives    Current Medications: Current Outpatient Medications  Medication Sig Dispense Refill   vortioxetine HBr (TRINTELLIX) 5 MG TABS tablet Take 1 tablet (5 mg total) by mouth daily. 30 tablet 2   valACYclovir (VALTREX)  1000 MG tablet Take 2 tablets (2,000 mg total) by mouth 2 (two) times daily for 1 day. 4 tablet 0   No current facility-administered medications for this visit.    ROS: Review of Systems  Constitutional:  Negative for appetite change and unexpected weight change.  Gastrointestinal:  Negative for constipation, diarrhea and nausea.  Genitourinary:        Erectile dysfunction  Musculoskeletal:  Negative for arthralgias.  Skin:        Hair loss  Neurological:  Negative for headaches.  Psychiatric/Behavioral:  Negative for dysphoric mood, hallucinations, sleep disturbance and suicidal ideas. The patient is nervous/anxious.     Objective:  Psychiatric Specialty Exam: There were no vitals taken for this visit.There is no height or weight on file to calculate BMI.  General Appearance: Casual, Fairly Groomed, and appears stated age  Eye Contact:  Good  Speech:  Clear and Coherent and Normal Rate  Volume:  Normal  Mood:   "The Lexapro is helping but giving me that same problem"  Affect:  Appropriate, Congruent, Constricted, and anxious  Thought Content: Logical and Hallucinations: None   Suicidal Thoughts:  No  Homicidal Thoughts:  No  Thought Process:  Coherent, Goal Directed, and Linear  Orientation:  Full (Time, Place, and Person)    Memory:  Immediate;   Good  Judgment:  Good  Insight:  Good  Concentration:  Concentration: Good and Attention Span: Good  Recall:  Good  Fund of Knowledge: Good  Language: Good  Psychomotor Activity:  Normal  Akathisia:  No  AIMS (if indicated): not done  Assets:  Communication Skills Desire for Improvement Financial Resources/Insurance Housing Leisure Time Physical Health Resilience Social Support Talents/Skills Transportation Vocational/Educational  ADL's:  Intact  Cognition: WNL  Sleep:  Good   PE: General: sits comfortably in view of camera; no acute distress  Pulm: no increased work of breathing on room air  MSK: all extremity  movements appear intact  Neuro: no focal neurological deficits observed  Gait & Station: unable to assess by video    Metabolic Disorder Labs: No results found for: "HGBA1C", "MPG" No results found for: "PROLACTIN" No results found for: "CHOL", "TRIG", "HDL", "CHOLHDL", "VLDL", "LDLCALC" Lab Results  Component Value Date   TSH 1.40 06/12/2022    Therapeutic Level Labs: No results found for: "LITHIUM" No results found for: "VALPROATE" No results found for: "CBMZ"  Screenings:  GAD-7    Flowsheet Row Counselor from 07/13/2022 in [facility] [place]  Total  GAD-7 Score 2      PHQ2-9    Flowsheet Row Office Visit from 05/29/2022 in [facility] at [facility] Total Score 0      Flowsheet Row Counselor from 07/13/2022 in [facility] [place] Office Visit from 05/29/2022 in [facility] [place] ED from [facility]/12/2022 in [facility] at [facility]  C-SSRS RISK CATEGORY No Risk No Risk No Risk       Collaboration of Care: Collaboration of Care: Medication Management AEB as above, Primary Care Provider AEB as above, and Referral or follow-up with counselor/therapist AEB as above  Patient/Guardian was advised Release of Information must be obtained prior to any record release in order to collaborate their care with an outside provider. Patient/Guardian was advised if they have not already done so to contact the registration department to sign all necessary forms in order for [place] to release information regarding their care.   Consent: Patient/Guardian gives verbal consent for treatment and assignment of benefits for services provided during this visit. Patient/Guardian expressed understanding and agreed to proceed.   Televisit via video: I connected with [place] on 07/13/23 at  1:30 PM EDT by a video enabled telemedicine  application and verified that I am speaking with the correct person using two identifiers.  Location: Patient: at home in [facility] Provider: Remote office in [place]   I discussed the limitations of evaluation and management by telemedicine and the availability of in person appointments. The patient expressed understanding and agreed to proceed.  I discussed the assessment and treatment plan with the patient. The patient was provided an opportunity to ask questions and all were answered. The patient agreed with the plan and demonstrated an understanding of the instructions.   The patient was advised to call back or seek an in-person evaluation if the symptoms worsen or if the condition fails to improve as anticipated.  I provided 20 minutes of virtual face-to-face time during this encounter.  Elsie Lincoln, MD 07/13/2023, 1:55 PM

## 2023-07-14 ENCOUNTER — Telehealth: Admitting: Physician Assistant

## 2023-07-14 DIAGNOSIS — B9689 Other specified bacterial agents as the cause of diseases classified elsewhere: Secondary | ICD-10-CM

## 2023-07-14 MED ORDER — AMOXICILLIN-POT CLAVULANATE 875-125 MG PO TABS: 1.0000 | ORAL_TABLET | Freq: Two times a day (BID) | ORAL | 0 refills | Status: DC

## 2023-07-14 NOTE — Progress Notes (Signed)

## 2023-07-16 ENCOUNTER — Other Ambulatory Visit (HOSPITAL_COMMUNITY): Payer: Self-pay | Admitting: Psychiatry

## 2023-07-16 DIAGNOSIS — N522 Drug-induced erectile dysfunction: Secondary | ICD-10-CM

## 2023-07-16 DIAGNOSIS — F410 Panic disorder [episodic paroxysmal anxiety] without agoraphobia: Secondary | ICD-10-CM

## 2023-07-16 MED ORDER — BUPROPION HCL ER (XL) 150 MG PO TB24: 150.0000 mg | ORAL_TABLET | ORAL | 2 refills | Status: DC

## 2023-07-16 NOTE — Progress Notes (Signed)
 Trintellix unfortunately not affordable so we will switch to Wellbutrin XL 150 mg once daily as discussed in recent appointment.

## 2023-08-17 ENCOUNTER — Encounter (HOSPITAL_COMMUNITY): Payer: Self-pay | Admitting: Psychiatry

## 2023-08-17 ENCOUNTER — Telehealth (HOSPITAL_COMMUNITY): Admitting: Psychiatry

## 2023-08-17 DIAGNOSIS — F410 Panic disorder [episodic paroxysmal anxiety] without agoraphobia: Secondary | ICD-10-CM

## 2023-08-17 DIAGNOSIS — N522 Drug-induced erectile dysfunction: Secondary | ICD-10-CM | POA: Diagnosis not present

## 2023-08-17 MED ORDER — PROPRANOLOL HCL 10 MG PO TABS: 10.0000 mg | ORAL_TABLET | Freq: Two times a day (BID) | ORAL | 5 refills | Status: DC | PRN

## 2023-08-17 MED ORDER — BUPROPION HCL ER (XL) 150 MG PO TB24: 150.0000 mg | ORAL_TABLET | ORAL | 5 refills | Status: DC

## 2023-08-17 NOTE — Patient Instructions (Signed)
 We added propranolol 10 mg twice daily as needed for panic attacks today.  This could lower your blood pressure so be careful when going from sitting to standing but you should be able to work out even after taking this medication.  Keep up the good work with cutting back on caffeine and follow through on that Zio patch test.

## 2023-08-17 NOTE — Progress Notes (Signed)
 BH MD Outpatient Progress Note  08/17/2023 1:56 PM Jacob Barber  MRN:  [id number]  Assessment:  Jacob Barber presents for follow-up evaluation. Today, 08/17/23, patient reports resolution of sexual side effects with switch to Wellbutrin  XL and has only had 2 panic attacks since starting that medication.  We will add as needed propranolol to assist.  There is still the possibility this is cardiac in origin we will try to get Zio patch through PCP but so far no evidence of Wellbutrin  worsening possible arrhythmia.  Unfortunately insurance would not cover Trintellix  to make an affordable but may not be needed at this point. Goal is to be off of medications if he can help it.  No follow-up planned due to provider transition.  Identifying Information: Jacob Barber is a 38 y.o. 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 Initial evaluation of panic attacks on 05/29/22; see that note for full case formulation.  Patient reported onset of panic attacks starting in 2017 which was 7 years after his father had a quadruple bypass and when he experienced the symptoms of the panic attacks this was highly distressing for him.  He had been assessed in the emergency department several times over the intervening years and had significant medical workup which was ultimately negative including stress tests, echocardiogram, EGD.  In the most recent emergency room visit he was given Ativan  which had prompted cessation of panic symptoms (tachycardia and tachypnea). He was noticing sexual side effect with decreased desire on Zoloft  so switched to Lexapro . Unfortunately having recurrence of panic disorder after being awoken from sleep by panic attack at the end of February but  has not had any since September 2024.  From my chart messages at that time restarted Lexapro  and while the panic attacks have been improving this was also in the setting of stopping caffeine use which had resumed previously.  It should also be noted that the sexual side effects did not resolve previously despite discontinuing the Lexapro  and also preceded SSRI use so may not actually been a medication side effect as when he was on Zoloft .  Urology referral was unrevealing.    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   Subjective:  Chief Complaint:  Chief Complaint  Patient presents with   Anxiety   Follow-up    Interval History: Patient arrived 10 minutes late to appointment.  Wellbutrin  has been working well but has had two episodes since last  appointment. One panic attack lasted an hour and resolved with being alone and driving away from a family event. Will get zio patch testing done hopefully soon. Does think Wellbutrin  XL has resolved the sexual side effects. Does think the panic attacks are  much fewer than before. Only side effect was feeling hot which has resolved. Has largely cut out caffeine and will have coke zero very infrequently if he needs something.    Visit Diagnosis:    ICD-10-CM   1. Panic disorder  F41.0 propranolol (INDERAL) 10 MG tablet    buPROPion  (WELLBUTRIN  XL) 150 MG 24 hr tablet    2. 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    Past Medical History:  Past Medical History:  Diagnosis Date   Childhood asthma    GERD (gastroesophageal reflux disease)    SSRI induced erectile dysfunction 07/13/2023    Past Surgical History:  Procedure Laterality Date   ESOPHAGOGASTRODUODENOSCOPY N/A 05/08/2016   Procedure: ESOPHAGOGASTRODUODENOSCOPY (EGD);  Surgeon: Ruby Corporal, MD;  Location: [facility];  Service: Endoscopy;  Laterality: N/A;  11:55    Family Psychiatric History: sister depression, father depression   Family History:  Family History  Problem Relation Age of Onset   CAD Father        Diagnosed in his 30s   Sleep apnea Father    Healthy Sister     Social History:  Social History   Socioeconomic History   Marital status: Single    Spouse name: Not on file   Number of children: Not on file   Years of education: Not on file   Highest education level: Not on file   Occupational History   Not on file  Tobacco Use   Smoking status: Former    Current packs/day: 0.00    Types: Cigarettes    Start date: 01/31/2012    Quit date: 02/23/2019    Years since quitting: 4.4   Smokeless tobacco: Never   Tobacco comments:    just on weekends in October 2023, denies use on 05/29/2022  Vaping Use   Vaping status: Never Used  Substance and Sexual Activity   Alcohol  use: Yes    Comment: 1-2 beers on a weekend day once or twice a month   Drug use: Not Currently    Types: Marijuana    Comment: Stop cannabis use in his early 97s   Sexual activity: Not on file  Other Topics Concern   Not on file  Social History Narrative   Not on file   Social Drivers of Health   Financial Resource Strain: Not on file  Food Insecurity: Not on file  Transportation Needs: Not on file  Physical Activity: Not on file  Stress: Not on file  Social Connections: Not on file    Allergies:  Allergies  Allergen Reactions   Erythromycin     hives   Sulfa Antibiotics  hives    Current Medications: Current Outpatient Medications  Medication Sig Dispense Refill   propranolol (INDERAL) 10 MG tablet Take 1 tablet (10 mg total) by mouth 2 (two) times daily as needed (Panic attack). 60 tablet 5   buPROPion  (WELLBUTRIN  XL) 150 MG 24 hr tablet Take 1 tablet (150 mg total) by mouth every morning. 30 tablet 5   No current facility-administered medications for this visit.    ROS: Review of Systems  Constitutional:  Negative for appetite change and unexpected weight change.  Gastrointestinal:  Negative for constipation, diarrhea and nausea.  Genitourinary:        No Erectile dysfunction  Musculoskeletal:  Negative for arthralgias.  Skin:        Hair loss  Neurological:  Negative for headaches.  Psychiatric/Behavioral:  Negative for dysphoric mood, hallucinations, sleep disturbance and suicidal ideas. The patient is nervous/anxious.     Objective:  Psychiatric Specialty  Exam: There were no vitals taken for this visit.There is no height or weight on file to calculate BMI.  General Appearance: Casual, Fairly Groomed, and appears stated age  Eye Contact:  Good  Speech:  Clear and Coherent and Normal Rate  Volume:  Normal  Mood:   "Good, I like Wellbutrin "  Affect:  Appropriate, Congruent, Constricted, and anxious but improving  Thought Content: Logical and Hallucinations: None   Suicidal Thoughts:  No  Homicidal Thoughts:  No  Thought Process:  Coherent, Goal Directed, and Linear  Orientation:  Full (Time, Place, and Person)    Memory:  Immediate;   Good  Judgment:  Good  Insight:  Good  Concentration:  Concentration: Good and Attention Span: Good  Recall:  Good  Fund of Knowledge: Good  Language: Good  Psychomotor Activity:  Normal  Akathisia:  No  AIMS (if indicated): not done  Assets:  Communication Skills Desire for Improvement Financial Resources/Insurance Housing Leisure Time Physical Health Resilience Social Support Talents/Skills Transportation Vocational/Educational  ADL's:  Intact  Cognition: WNL  Sleep:  Good   PE: General: sits comfortably in view of camera; no acute distress  Pulm: no increased work of breathing on room air  MSK: all extremity movements appear intact  Neuro: no focal neurological deficits observed  Gait & Station: unable to assess by video    Metabolic Disorder Labs: No results found for: "HGBA1C", "MPG" No results found for: "PROLACTIN" No results found for: "CHOL", "TRIG", "HDL", "CHOLHDL", "VLDL", "LDLCALC" Lab Results  Component Value Date   TSH 1.40 06/12/2022    Therapeutic Level Labs: No results found for: "LITHIUM" No results found for: "VALPROATE" No results found for: "CBMZ"  Screenings:  GAD-7    Flowsheet Row Counselor from 07/13/2022 in [facility]  Total GAD-7 Score 2      PHQ2-9    Flowsheet Row Office Visit from 05/29/2022 in [facility] Total Score 0      Flowsheet Row Counselor from 07/13/2022 in [facility] Office Visit from 05/29/2022 in [facility] [place] ED from 02/12/2022 in [facility] Department at [facility]  C-SSRS RISK CATEGORY No Risk No Risk No Risk       Collaboration of Care: Collaboration of Care: Medication Management AEB as above, Primary Care Provider AEB as above, and Referral or follow-up with counselor/therapist AEB as above  Patient/Guardian was advised Release of Information must be obtained prior to  any record release in order to collaborate their care with an outside provider. Patient/Guardian was advised if they have not already done so to contact the registration department to sign all necessary forms in order for us  to release information regarding their care.   Consent: Patient/Guardian gives verbal consent for treatment and assignment of benefits for services provided during this visit. Patient/Guardian expressed understanding and agreed to proceed.   Televisit via video: I connected with Brentt on 08/17/23 at  1:30 PM EDT by a video enabled telemedicine application and verified that I am speaking with the correct person using two identifiers.  Location: Patient: at work in [place]  Provider: Remote office in [place]    I discussed the limitations of evaluation and management by telemedicine and the availability of in person appointments. The patient expressed understanding and agreed to proceed.  I discussed the assessment and treatment plan with the patient. The patient was provided an opportunity to ask questions and all were answered. The patient agreed with the plan and demonstrated an understanding of the instructions.   The patient was advised to call back or seek an in-person evaluation if the symptoms worsen or if the  condition fails to improve as anticipated.  I provided 20 minutes of virtual face-to-face time during this encounter.  Madie Schilling, MD 08/17/2023, 1:56 PM

## 2023-08-19 ENCOUNTER — Other Ambulatory Visit: Payer: Self-pay | Admitting: *Deleted

## 2023-08-19 ENCOUNTER — Ambulatory Visit: Attending: Physician Assistant

## 2023-08-19 DIAGNOSIS — R002 Palpitations: Secondary | ICD-10-CM

## 2023-08-19 NOTE — Progress Notes (Unsigned)
 Enrolled for [employer] to mail a ZIO XT long term holter monitor to the patients address on file.

## 2023-09-02 DIAGNOSIS — R002 Palpitations: Secondary | ICD-10-CM | POA: Diagnosis not present

## 2023-10-07 DIAGNOSIS — R002 Palpitations: Secondary | ICD-10-CM

## 2023-10-21 DIAGNOSIS — R7989 Other specified abnormal findings of blood chemistry: Secondary | ICD-10-CM | POA: Diagnosis not present

## 2023-10-21 DIAGNOSIS — E782 Mixed hyperlipidemia: Secondary | ICD-10-CM | POA: Diagnosis not present

## 2023-10-21 DIAGNOSIS — R002 Palpitations: Secondary | ICD-10-CM | POA: Diagnosis not present

## 2023-10-21 DIAGNOSIS — F410 Panic disorder [episodic paroxysmal anxiety] without agoraphobia: Secondary | ICD-10-CM | POA: Diagnosis not present

## 2023-10-21 DIAGNOSIS — F419 Anxiety disorder, unspecified: Secondary | ICD-10-CM | POA: Diagnosis not present

## 2023-11-16 DIAGNOSIS — F411 Generalized anxiety disorder: Secondary | ICD-10-CM | POA: Diagnosis not present

## 2023-11-16 DIAGNOSIS — L658 Other specified nonscarring hair loss: Secondary | ICD-10-CM | POA: Diagnosis not present

## 2023-11-16 DIAGNOSIS — F410 Panic disorder [episodic paroxysmal anxiety] without agoraphobia: Secondary | ICD-10-CM | POA: Diagnosis not present

## 2023-11-16 DIAGNOSIS — E291 Testicular hypofunction: Secondary | ICD-10-CM | POA: Diagnosis not present

## 2023-11-16 DIAGNOSIS — R5382 Chronic fatigue, unspecified: Secondary | ICD-10-CM | POA: Diagnosis not present

## 2023-11-16 DIAGNOSIS — N529 Male erectile dysfunction, unspecified: Secondary | ICD-10-CM | POA: Diagnosis not present

## 2023-11-16 DIAGNOSIS — R891 Abnormal level of hormones in specimens from other organs, systems and tissues: Secondary | ICD-10-CM | POA: Diagnosis not present

## 2024-01-20 IMAGING — DX DG HAND COMPLETE 3+V*L*
3 series · 3 of 3 positions shown · non-contrast
Comparison: None.

CLINICAL DATA: Left thumb injury today

EXAM:
LEFT HAND - COMPLETE 3+ VIEW

[hand pa]
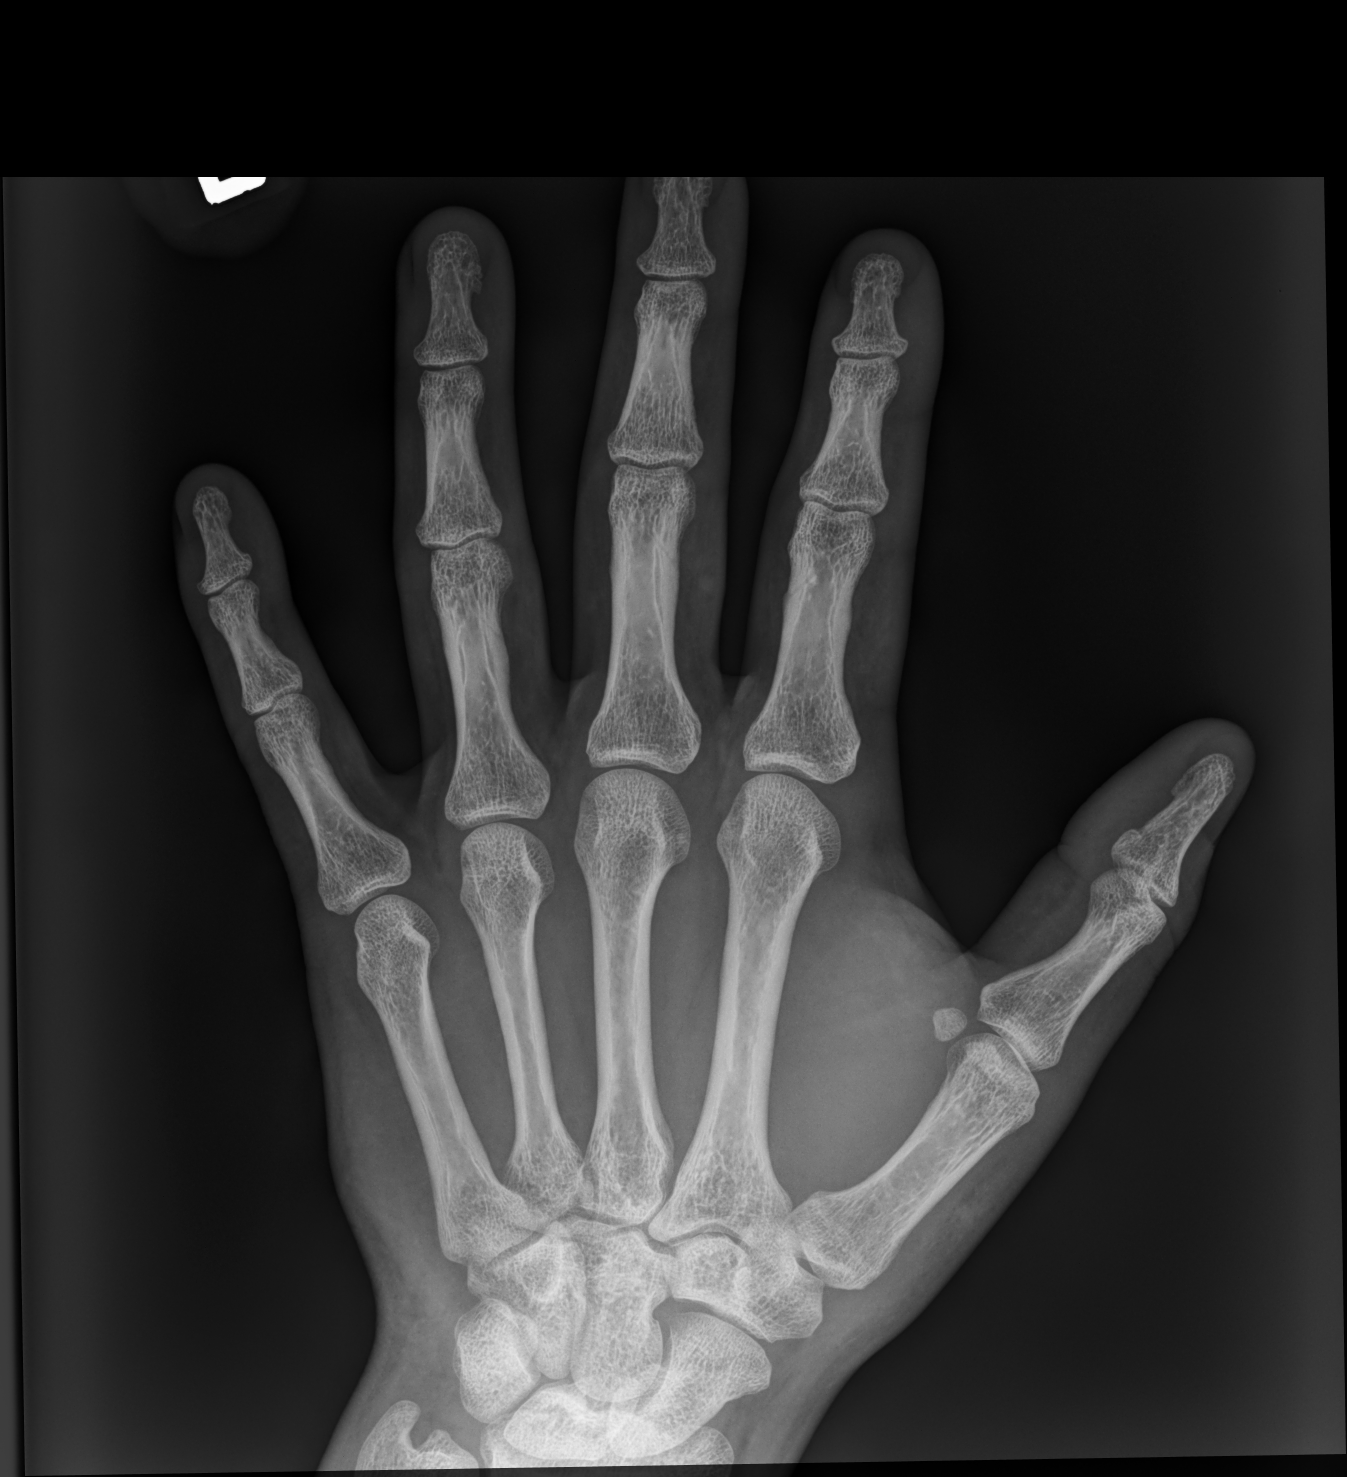

[hand mlo]
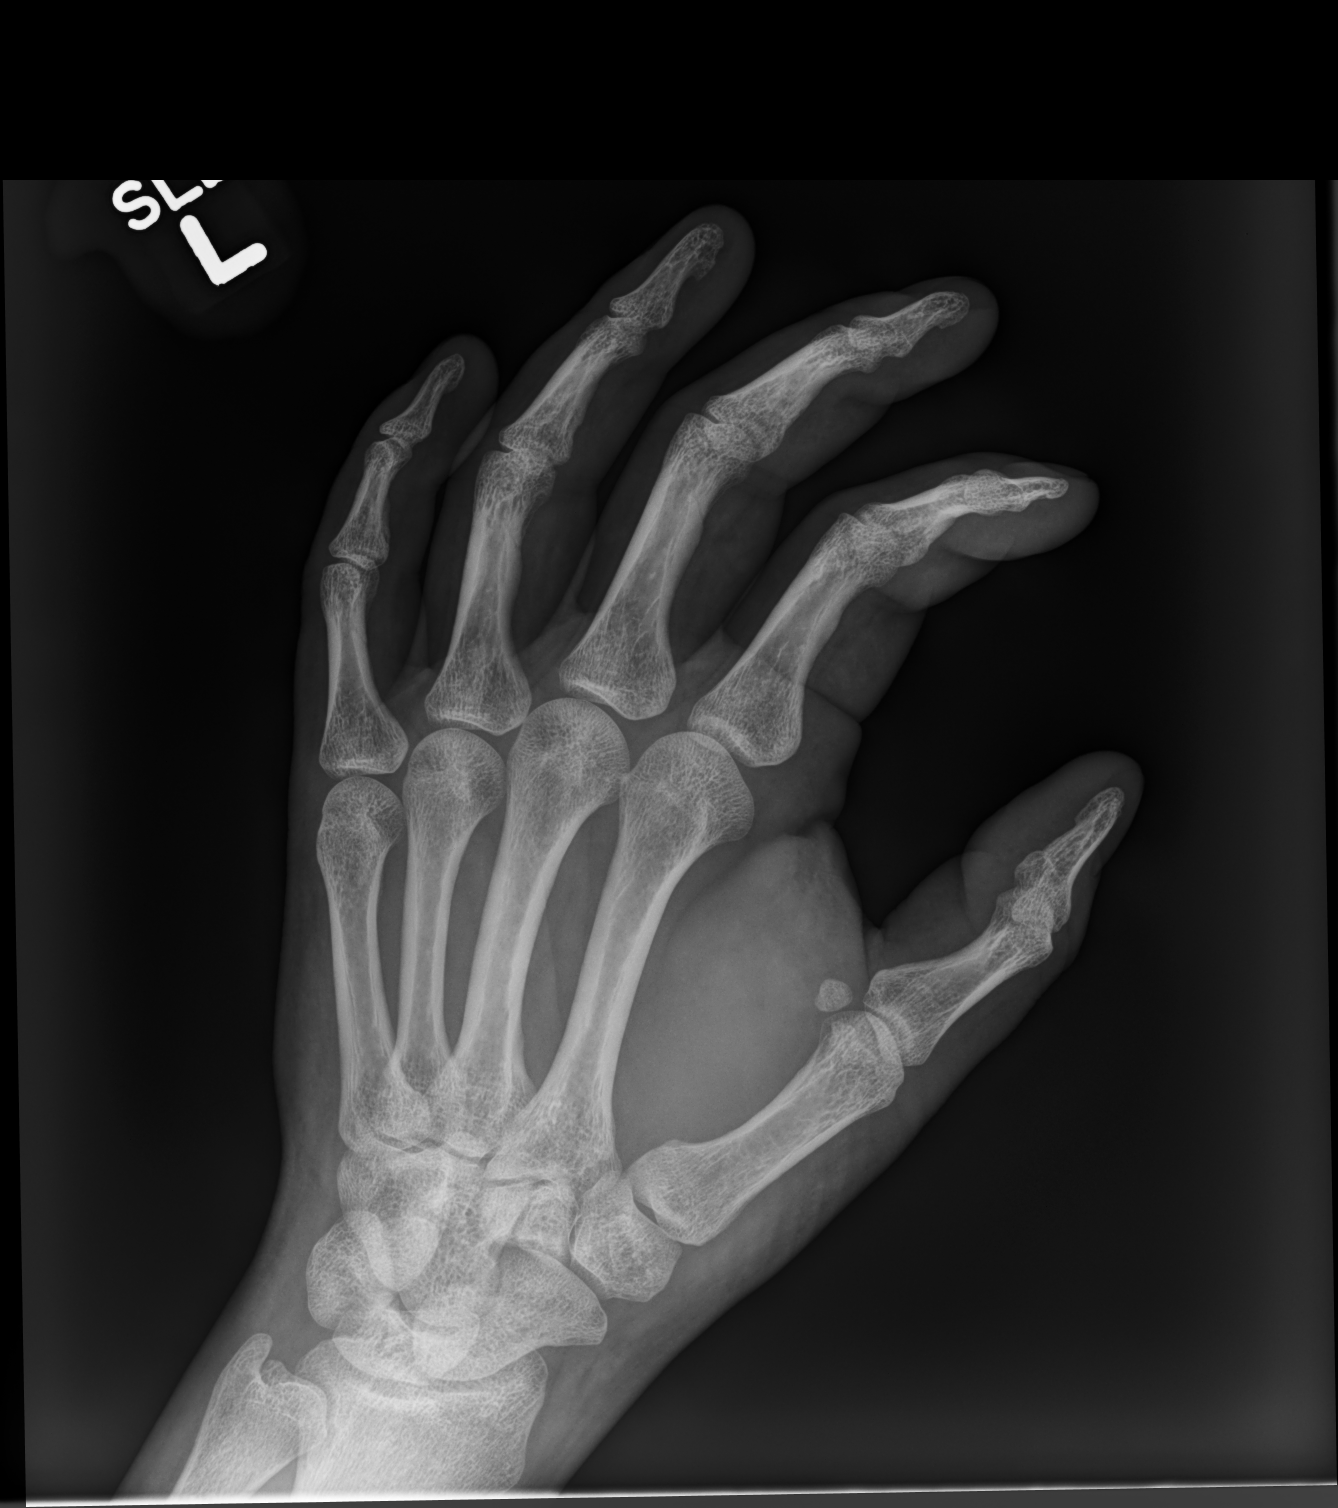

[hand lat]
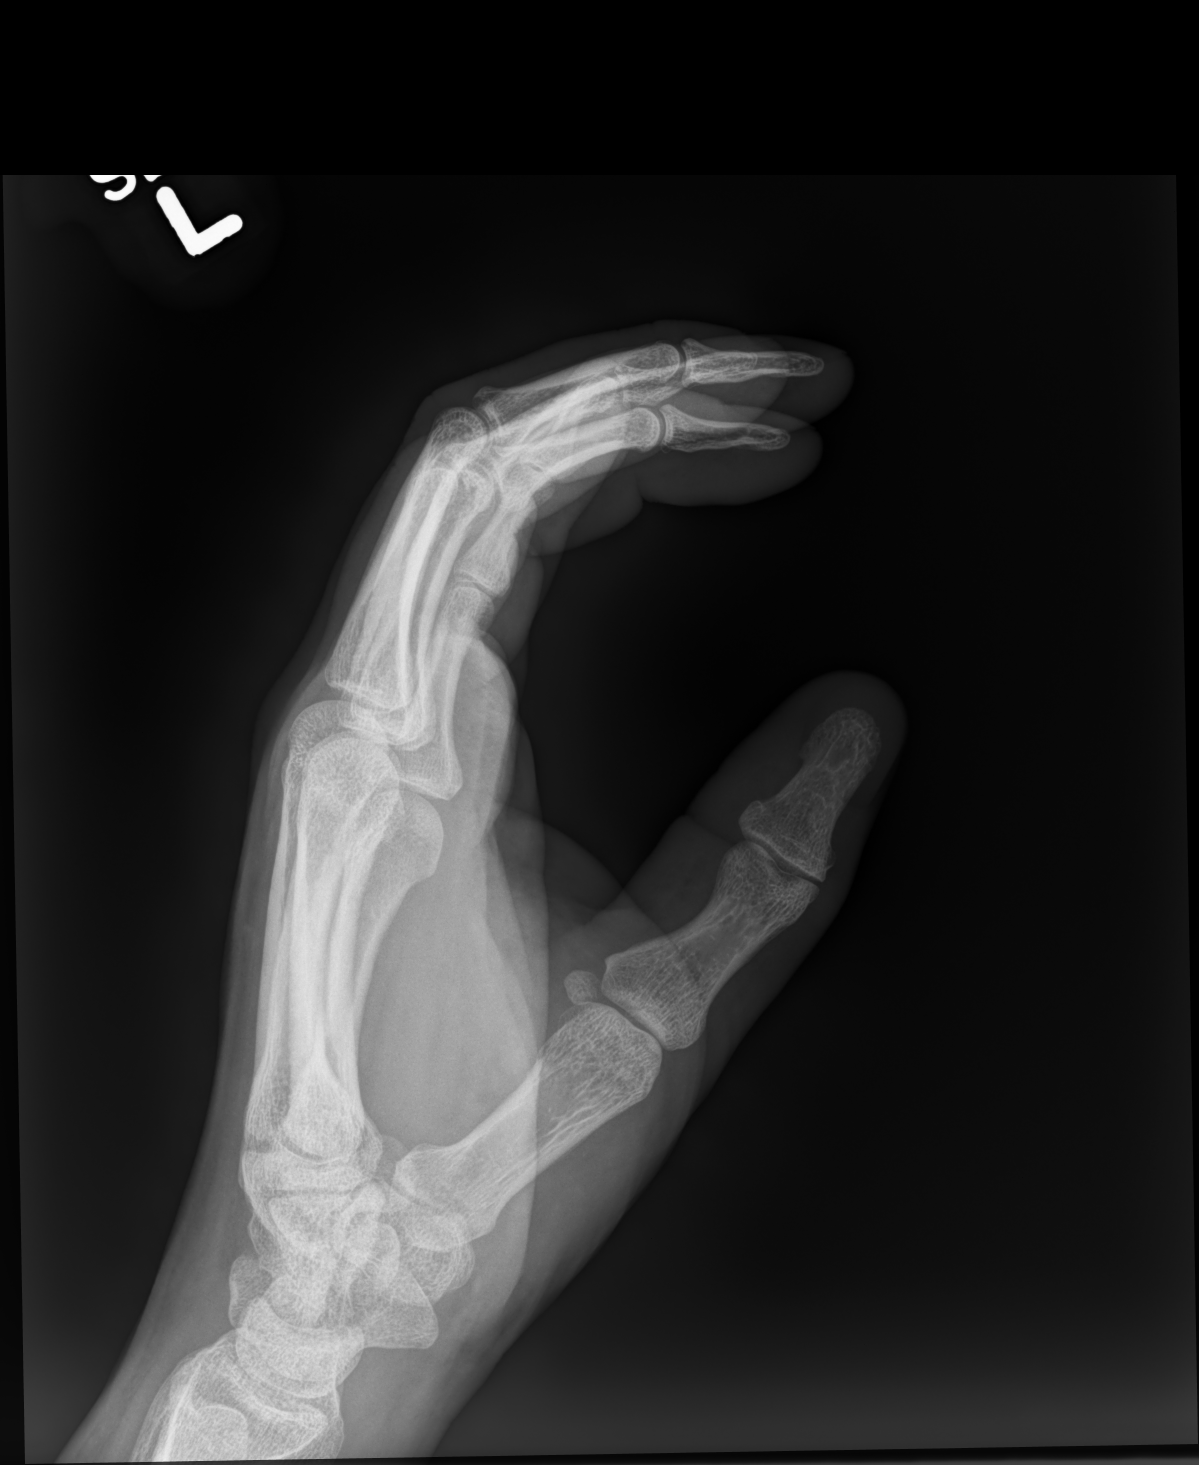

[3 of 3 positions shown; findings below may reference images not displayed]

FINDINGS: Frontal, oblique, and lateral views of the left hand are obtained.
No acute fracture, subluxation, or dislocation. Joint spaces are
well preserved. Soft tissues are unremarkable.
IMPRESSION: 1. Unremarkable left hand.

## 2024-02-04 ENCOUNTER — Telehealth: Admitting: Physician Assistant

## 2024-02-04 DIAGNOSIS — B001 Herpesviral vesicular dermatitis: Secondary | ICD-10-CM

## 2024-02-04 MED ORDER — VALACYCLOVIR HCL 1 G PO TABS: 2000.0000 mg | ORAL_TABLET | Freq: Two times a day (BID) | ORAL | 0 refills | Status: AC

## 2024-02-04 NOTE — Progress Notes (Signed)
 We are sorry that you are not feeling well.  Here is how we plan to help!  Based on what you have shared with me it does look like you have a viral infection.    Most cold sores or fever blisters are small fluid filled blisters around the mouth caused by herpes simplex virus.  The most common strain of the virus causing cold sores is herpes simplex virus 1.  It can be spread by skin contact, sharing eating utensils, or even sharing towels.  Cold sores are contagious to other people until dry. (Approximately 5-7 days).  Wash your hands. You can spread the virus to your eyes through handling your contact lenses after touching the lesions.  Most people experience pain at the sight or tingling sensations in their lips that may begin before the ulcers erupt.  Herpes simplex is treatable but not curable.  It may lie dormant for a long time and then reappear due to stress or prolonged sun exposure.  Many patients have success in treating their cold sores with an over the counter topical called Abreva.  You may apply the cream up to 5 times daily (maximum 10 days) until healing occurs.  If you would like to use an oral antiviral medication to speed the healing of your cold sore, I have sent a prescription to your local pharmacy Valacyclovir  2 gm take one by mouth twice a day for 1 day    HOME CARE:  Wash your hands frequently. Do not pick at or rub the sore. Don't open the blisters. Avoid kissing other people during this time. Avoid sharing drinking glasses, eating utensils, or razors. Do not handle contact lenses unless you have thoroughly washed your hands with soap and warm water ! Avoid oral sex during this time.  Herpes from sores on your mouth can spread to your partner's genital area. Avoid contact with anyone who has eczema or a weakened immune system. Cold sores are often triggered by exposure to intense sunlight, use a lip balm containing a sunscreen (SPF 30 or higher).  GET HELP RIGHT AWAY  IF:  Blisters look infected. Blisters occur near or in the eye. Symptoms last longer than 10 days. Your symptoms become worse.  MAKE SURE YOU:  Understand these instructions. Will watch your condition. Will get help right away if you are not doing well or get worse.    Your e-visit answers were reviewed by a board certified advanced clinical practitioner to complete your personal care plan.  Depending upon the condition, your plan could have  Included both over the counter or prescription medications.    Please review your pharmacy choice.  Be sure that the pharmacy you have chosen is open so that you can pick up your prescription now.  If there is a problem you can message your provider in MyChart to have the prescription routed to another pharmacy.    Your safety is important to us .  If you have drug allergies check our prescription carefully.  For the next 24 hours you can use MyChart to ask questions about today's visit, request a non-urgent call back, or ask for a work or school excuse from your e-visit provider.  You will get an email in the next two days asking about your experience.  I hope that your e-visit has been valuable and will speed your recovery.   I have spent 5 minutes in review of e-visit questionnaire, review and updating patient chart, medical decision making and response to patient.  Teena Shuck, PA-C

## 2024-02-15 DIAGNOSIS — R891 Abnormal level of hormones in specimens from other organs, systems and tissues: Secondary | ICD-10-CM | POA: Diagnosis not present

## 2024-02-15 DIAGNOSIS — L658 Other specified nonscarring hair loss: Secondary | ICD-10-CM | POA: Diagnosis not present

## 2024-02-15 DIAGNOSIS — R5382 Chronic fatigue, unspecified: Secondary | ICD-10-CM | POA: Diagnosis not present

## 2024-03-23 ENCOUNTER — Telehealth: Admitting: Physician Assistant

## 2024-03-23 DIAGNOSIS — B001 Herpesviral vesicular dermatitis: Secondary | ICD-10-CM | POA: Diagnosis not present

## 2024-03-23 MED ORDER — VALACYCLOVIR HCL 1 G PO TABS: 2000.0000 mg | ORAL_TABLET | Freq: Two times a day (BID) | ORAL | 1 refills | Status: AC

## 2024-03-23 NOTE — Progress Notes (Signed)
 We are sorry that you are not feeling well.  Here is how we plan to help!  Based on what you have shared with me it does look like you have a viral infection.    Most cold sores or fever blisters are small fluid filled blisters around the mouth caused by herpes simplex virus.  The most common strain of the virus causing cold sores is herpes simplex virus 1.  It can be spread by skin contact, sharing eating utensils, or even sharing towels.  Cold sores are contagious to other people until dry. (Approximately 5-7 days).  Wash your hands. You can spread the virus to your eyes through handling your contact lenses after touching the lesions.  Most people experience pain at the sight or tingling sensations in their lips that may begin before the ulcers erupt.  Herpes simplex is treatable but not curable.  It may lie dormant for a long time and then reappear due to stress or prolonged sun exposure.  Many patients have success in treating their cold sores with an over the counter topical called Abreva.  You may apply the cream up to 5 times daily (maximum 10 days) until healing occurs.  If you would like to use an oral antiviral medication to speed the healing of your cold sore, I have sent a prescription to your local pharmacy Valacyclovir  2 gm take one by mouth twice a day for 1 day    HOME CARE:  Wash your hands frequently. Do not pick at or rub the sore. Don't open the blisters. Avoid kissing other people during this time. Avoid sharing drinking glasses, eating utensils, or razors. Do not handle contact lenses unless you have thoroughly washed your hands with soap and warm water ! Avoid oral sex during this time.  Herpes from sores on your mouth can spread to your partner's genital area. Avoid contact with anyone who has eczema or a weakened immune system. Cold sores are often triggered by exposure to intense sunlight, use a lip balm containing a sunscreen (SPF 30 or higher).  GET HELP RIGHT AWAY  IF:  Blisters look infected. Blisters occur near or in the eye. Symptoms last longer than 10 days. Your symptoms become worse.  MAKE SURE YOU:  Understand these instructions. Will watch your condition. Will get help right away if you are not doing well or get worse.    Your e-visit answers were reviewed by a board certified advanced clinical practitioner to complete your personal care plan.  Depending upon the condition, your plan could have  Included both over the counter or prescription medications.    Please review your pharmacy choice.  Be sure that the pharmacy you have chosen is open so that you can pick up your prescription now.  If there is a problem you can message your provider in MyChart to have the prescription routed to another pharmacy.    Your safety is important to us .  If you have drug allergies check our prescription carefully.  For the next 24 hours you can use MyChart to ask questions about today's visit, request a non-urgent call back, or ask for a work or school excuse from your e-visit provider.  You will get an email in the next two days asking about your experience.  I hope that your e-visit has been valuable and will speed your recovery.   I have spent 5 minutes in review of e-visit questionnaire, review and updating patient chart, medical decision making and response to patient.  Maahir Horst, PA-C

## 2024-03-27 ENCOUNTER — Telehealth: Admitting: Physician Assistant

## 2024-03-27 DIAGNOSIS — M436 Torticollis: Secondary | ICD-10-CM

## 2024-03-27 DIAGNOSIS — J069 Acute upper respiratory infection, unspecified: Secondary | ICD-10-CM

## 2024-03-27 NOTE — Progress Notes (Signed)
   Thank you for the details you included in the comment boxes. Those details are very helpful in determining the best course of treatment for you and help us  to provide the best care.Because of severity of headache and associated neck stiffness, we recommend that you schedule a Virtual Urgent Care video visit in order for the provider to better assess what is going on.  The provider will be able to give you a more accurate diagnosis and treatment plan if we can more freely discuss your symptoms and with the addition of a virtual examination.   If you change your visit to a video visit, we will bill your insurance (similar to an office visit) and you will not be charged for this e-Visit. You will be able to stay at home and speak with the first available [facility] advanced practice provider. The link to do a video visit is in the drop down Menu tab of your Welcome screen in MyChart.
